# Patient Record
Sex: Female | Born: 1950
Health system: Southern US, Community
[De-identification: ages and names within clinical notes are randomized; demographics above are authoritative.]

## PROBLEM LIST (undated history)

## (undated) DIAGNOSIS — E119 Type 2 diabetes mellitus without complications: Secondary | ICD-10-CM

## (undated) DIAGNOSIS — G4730 Sleep apnea, unspecified: Secondary | ICD-10-CM

## (undated) DIAGNOSIS — N281 Cyst of kidney, acquired: Secondary | ICD-10-CM

## (undated) DIAGNOSIS — K219 Gastro-esophageal reflux disease without esophagitis: Secondary | ICD-10-CM

## (undated) DIAGNOSIS — F209 Schizophrenia, unspecified: Secondary | ICD-10-CM

## (undated) DIAGNOSIS — I509 Heart failure, unspecified: Secondary | ICD-10-CM

## (undated) DIAGNOSIS — K648 Other hemorrhoids: Secondary | ICD-10-CM

## (undated) DIAGNOSIS — E785 Hyperlipidemia, unspecified: Secondary | ICD-10-CM

## (undated) DIAGNOSIS — H409 Unspecified glaucoma: Secondary | ICD-10-CM

## (undated) DIAGNOSIS — I10 Essential (primary) hypertension: Secondary | ICD-10-CM

## (undated) DIAGNOSIS — F329 Major depressive disorder, single episode, unspecified: Secondary | ICD-10-CM

## (undated) DIAGNOSIS — M1990 Unspecified osteoarthritis, unspecified site: Secondary | ICD-10-CM

## (undated) DIAGNOSIS — K5790 Diverticulosis of intestine, part unspecified, without perforation or abscess without bleeding: Secondary | ICD-10-CM

## (undated) DIAGNOSIS — F32A Depression, unspecified: Secondary | ICD-10-CM

## (undated) DIAGNOSIS — E669 Obesity, unspecified: Secondary | ICD-10-CM

## (undated) HISTORY — DX: Gastro-esophageal reflux disease without esophagitis: K21.9

## (undated) HISTORY — DX: Essential (primary) hypertension: I10

## (undated) HISTORY — PX: CARPAL TUNNEL RELEASE: SHX101

## (undated) HISTORY — DX: Diverticulosis of intestine, part unspecified, without perforation or abscess without bleeding: K57.90

## (undated) HISTORY — DX: Sleep apnea, unspecified: G47.30

## (undated) HISTORY — DX: Hyperlipidemia, unspecified: E78.5

## (undated) HISTORY — DX: Obesity, unspecified: E66.9

## (undated) HISTORY — PX: LAPAROSCOPY: SHX197

## (undated) HISTORY — DX: Major depressive disorder, single episode, unspecified: F32.9

## (undated) HISTORY — DX: Heart failure, unspecified: I50.9

## (undated) HISTORY — DX: Unspecified osteoarthritis, unspecified site: M19.90

## (undated) HISTORY — DX: Depression, unspecified: F32.A

## (undated) HISTORY — PX: CATARACT EXTRACTION, BILATERAL: SHX1313

## (undated) HISTORY — PX: WRIST SURGERY: SHX841

## (undated) HISTORY — PX: ABDOMINAL HYSTERECTOMY: SHX81

## (undated) HISTORY — DX: Type 2 diabetes mellitus without complications: E11.9

## (undated) HISTORY — DX: Other hemorrhoids: K64.8

## (undated) HISTORY — PX: EYE SURGERY: SHX253

## (undated) HISTORY — DX: Unspecified glaucoma: H40.9

## (undated) HISTORY — DX: Schizophrenia, unspecified: F20.9

---
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
## 1999-10-12 ENCOUNTER — Encounter: Payer: Self-pay | Admitting: Emergency Medicine

## 1999-10-12 ENCOUNTER — Inpatient Hospital Stay (HOSPITAL_COMMUNITY): Admission: EM | Admit: 1999-10-12 | Discharge: 1999-10-18 | Payer: Self-pay | Admitting: Emergency Medicine

## 1999-10-17 ENCOUNTER — Encounter: Payer: Self-pay | Admitting: Family Medicine

## 1999-10-24 ENCOUNTER — Encounter: Admission: RE | Admit: 1999-10-24 | Discharge: 1999-10-24 | Payer: Self-pay | Admitting: Family Medicine

## 1999-11-14 ENCOUNTER — Encounter: Admission: RE | Admit: 1999-11-14 | Discharge: 1999-11-14 | Payer: Self-pay | Admitting: Family Medicine

## 1999-11-14 ENCOUNTER — Encounter: Admission: RE | Admit: 1999-11-14 | Discharge: 1999-11-14 | Payer: Self-pay | Admitting: Sports Medicine

## 1999-11-14 ENCOUNTER — Ambulatory Visit (HOSPITAL_COMMUNITY): Admission: RE | Admit: 1999-11-14 | Discharge: 1999-11-14 | Payer: Self-pay | Admitting: *Deleted

## 1999-11-14 ENCOUNTER — Encounter: Payer: Self-pay | Admitting: Sports Medicine

## 1999-11-21 ENCOUNTER — Encounter: Admission: RE | Admit: 1999-11-21 | Discharge: 1999-11-21 | Payer: Self-pay | Admitting: Family Medicine

## 1999-12-11 ENCOUNTER — Encounter: Admission: RE | Admit: 1999-12-11 | Discharge: 1999-12-11 | Payer: Self-pay | Admitting: Family Medicine

## 2000-01-21 ENCOUNTER — Ambulatory Visit (HOSPITAL_BASED_OUTPATIENT_CLINIC_OR_DEPARTMENT_OTHER): Admission: RE | Admit: 2000-01-21 | Discharge: 2000-01-21 | Payer: Self-pay

## 2000-01-24 ENCOUNTER — Encounter: Admission: RE | Admit: 2000-01-24 | Discharge: 2000-01-24 | Payer: Self-pay | Admitting: Family Medicine

## 2000-01-31 ENCOUNTER — Encounter: Admission: RE | Admit: 2000-01-31 | Discharge: 2000-04-30 | Payer: Self-pay | Admitting: *Deleted

## 2000-02-19 ENCOUNTER — Encounter: Admission: RE | Admit: 2000-02-19 | Discharge: 2000-02-19 | Payer: Self-pay | Admitting: Family Medicine

## 2000-05-06 ENCOUNTER — Ambulatory Visit (HOSPITAL_COMMUNITY): Admission: RE | Admit: 2000-05-06 | Discharge: 2000-05-06 | Payer: Self-pay | Admitting: Family Medicine

## 2000-05-06 ENCOUNTER — Encounter: Admission: RE | Admit: 2000-05-06 | Discharge: 2000-05-06 | Payer: Self-pay | Admitting: Family Medicine

## 2000-05-21 ENCOUNTER — Encounter: Admission: RE | Admit: 2000-05-21 | Discharge: 2000-05-21 | Payer: Self-pay | Admitting: Family Medicine

## 2000-05-26 ENCOUNTER — Encounter: Admission: RE | Admit: 2000-05-26 | Discharge: 2000-08-24 | Payer: Self-pay | Admitting: *Deleted

## 2000-06-17 ENCOUNTER — Encounter: Admission: RE | Admit: 2000-06-17 | Discharge: 2000-06-17 | Payer: Self-pay | Admitting: Family Medicine

## 2000-07-07 ENCOUNTER — Encounter: Admission: RE | Admit: 2000-07-07 | Discharge: 2000-07-07 | Payer: Self-pay | Admitting: Family Medicine

## 2000-08-27 ENCOUNTER — Encounter: Admission: RE | Admit: 2000-08-27 | Discharge: 2000-08-27 | Payer: Self-pay | Admitting: Family Medicine

## 2000-10-08 ENCOUNTER — Emergency Department (HOSPITAL_COMMUNITY): Admission: EM | Admit: 2000-10-08 | Discharge: 2000-10-08 | Payer: Self-pay | Admitting: Emergency Medicine

## 2000-10-09 ENCOUNTER — Encounter: Payer: Self-pay | Admitting: Emergency Medicine

## 2000-10-21 ENCOUNTER — Encounter: Admission: RE | Admit: 2000-10-21 | Discharge: 2001-01-19 | Payer: Self-pay | Admitting: Sports Medicine

## 2001-03-02 ENCOUNTER — Encounter: Admission: RE | Admit: 2001-03-02 | Discharge: 2001-05-31 | Payer: Self-pay | Admitting: Sports Medicine

## 2001-04-02 ENCOUNTER — Encounter: Admission: RE | Admit: 2001-04-02 | Discharge: 2001-04-02 | Payer: Self-pay | Admitting: Sports Medicine

## 2001-04-08 ENCOUNTER — Encounter: Admission: RE | Admit: 2001-04-08 | Discharge: 2001-04-08 | Payer: Self-pay | Admitting: Family Medicine

## 2001-05-03 ENCOUNTER — Ambulatory Visit (HOSPITAL_BASED_OUTPATIENT_CLINIC_OR_DEPARTMENT_OTHER): Admission: RE | Admit: 2001-05-03 | Discharge: 2001-05-03 | Payer: Self-pay | Admitting: Family Medicine

## 2001-06-11 ENCOUNTER — Encounter: Admission: RE | Admit: 2001-06-11 | Discharge: 2001-09-09 | Payer: Self-pay | Admitting: Sports Medicine

## 2001-06-15 ENCOUNTER — Encounter: Admission: RE | Admit: 2001-06-15 | Discharge: 2001-06-15 | Payer: Self-pay | Admitting: Family Medicine

## 2001-06-21 ENCOUNTER — Encounter (INDEPENDENT_AMBULATORY_CARE_PROVIDER_SITE_OTHER): Payer: Self-pay | Admitting: *Deleted

## 2001-06-30 ENCOUNTER — Encounter: Admission: RE | Admit: 2001-06-30 | Discharge: 2001-06-30 | Payer: Self-pay | Admitting: Sports Medicine

## 2001-06-30 ENCOUNTER — Encounter: Payer: Self-pay | Admitting: Sports Medicine

## 2001-08-31 ENCOUNTER — Encounter: Admission: RE | Admit: 2001-08-31 | Discharge: 2001-08-31 | Payer: Self-pay | Admitting: Family Medicine

## 2001-09-04 ENCOUNTER — Encounter: Payer: Self-pay | Admitting: Ophthalmology

## 2001-09-07 ENCOUNTER — Ambulatory Visit (HOSPITAL_COMMUNITY): Admission: RE | Admit: 2001-09-07 | Discharge: 2001-09-07 | Payer: Self-pay | Admitting: Ophthalmology

## 2001-09-18 ENCOUNTER — Encounter: Admission: RE | Admit: 2001-09-18 | Discharge: 2001-12-17 | Payer: Self-pay | Admitting: Sports Medicine

## 2002-01-06 ENCOUNTER — Encounter: Admission: RE | Admit: 2002-01-06 | Discharge: 2002-01-06 | Payer: Self-pay | Admitting: Family Medicine

## 2002-01-21 ENCOUNTER — Encounter: Admission: RE | Admit: 2002-01-21 | Discharge: 2002-01-21 | Payer: Self-pay | Admitting: Family Medicine

## 2002-01-27 ENCOUNTER — Encounter: Admission: RE | Admit: 2002-01-27 | Discharge: 2002-04-27 | Payer: Self-pay | Admitting: Sports Medicine

## 2002-02-01 ENCOUNTER — Ambulatory Visit (HOSPITAL_COMMUNITY): Admission: RE | Admit: 2002-02-01 | Discharge: 2002-02-01 | Payer: Self-pay | Admitting: Ophthalmology

## 2002-03-05 ENCOUNTER — Encounter: Admission: RE | Admit: 2002-03-05 | Discharge: 2002-03-05 | Payer: Self-pay | Admitting: Family Medicine

## 2002-03-11 ENCOUNTER — Encounter: Admission: RE | Admit: 2002-03-11 | Discharge: 2002-03-11 | Payer: Self-pay | Admitting: Family Medicine

## 2002-04-14 ENCOUNTER — Ambulatory Visit (HOSPITAL_COMMUNITY): Admission: RE | Admit: 2002-04-14 | Discharge: 2002-04-14 | Payer: Self-pay | Admitting: Ophthalmology

## 2002-05-17 ENCOUNTER — Encounter: Admission: RE | Admit: 2002-05-17 | Discharge: 2002-05-17 | Payer: Self-pay | Admitting: Family Medicine

## 2002-07-27 ENCOUNTER — Encounter: Admission: RE | Admit: 2002-07-27 | Discharge: 2002-07-27 | Payer: Self-pay | Admitting: Family Medicine

## 2002-08-10 ENCOUNTER — Encounter: Admission: RE | Admit: 2002-08-10 | Discharge: 2002-08-10 | Payer: Self-pay | Admitting: Sports Medicine

## 2002-08-10 ENCOUNTER — Encounter: Payer: Self-pay | Admitting: Sports Medicine

## 2002-08-24 ENCOUNTER — Encounter: Admission: RE | Admit: 2002-08-24 | Discharge: 2002-11-22 | Payer: Self-pay | Admitting: Sports Medicine

## 2002-09-06 ENCOUNTER — Encounter: Admission: RE | Admit: 2002-09-06 | Discharge: 2002-09-06 | Payer: Self-pay | Admitting: Family Medicine

## 2002-09-23 ENCOUNTER — Encounter: Admission: RE | Admit: 2002-09-23 | Discharge: 2002-09-23 | Payer: Self-pay | Admitting: Family Medicine

## 2002-10-08 ENCOUNTER — Encounter: Admission: RE | Admit: 2002-10-08 | Discharge: 2002-10-08 | Payer: Self-pay | Admitting: Family Medicine

## 2002-10-14 ENCOUNTER — Encounter: Admission: RE | Admit: 2002-10-14 | Discharge: 2002-10-14 | Payer: Self-pay | Admitting: Family Medicine

## 2003-02-25 ENCOUNTER — Encounter: Admission: RE | Admit: 2003-02-25 | Discharge: 2003-02-25 | Payer: Self-pay | Admitting: Family Medicine

## 2003-03-02 ENCOUNTER — Encounter: Admission: RE | Admit: 2003-03-02 | Discharge: 2003-03-02 | Payer: Self-pay | Admitting: Sports Medicine

## 2003-03-02 ENCOUNTER — Encounter: Payer: Self-pay | Admitting: Sports Medicine

## 2003-03-30 ENCOUNTER — Encounter: Admission: RE | Admit: 2003-03-30 | Discharge: 2003-03-30 | Payer: Self-pay | Admitting: Family Medicine

## 2003-04-28 ENCOUNTER — Encounter: Admission: RE | Admit: 2003-04-28 | Discharge: 2003-04-28 | Payer: Self-pay | Admitting: Family Medicine

## 2003-05-12 ENCOUNTER — Encounter: Admission: RE | Admit: 2003-05-12 | Discharge: 2003-05-12 | Payer: Self-pay | Admitting: Family Medicine

## 2003-08-10 ENCOUNTER — Emergency Department (HOSPITAL_COMMUNITY): Admission: EM | Admit: 2003-08-10 | Discharge: 2003-08-10 | Payer: Self-pay | Admitting: Emergency Medicine

## 2003-09-07 ENCOUNTER — Encounter: Admission: RE | Admit: 2003-09-07 | Discharge: 2003-09-07 | Payer: Self-pay | Admitting: Family Medicine

## 2003-09-30 ENCOUNTER — Encounter: Admission: RE | Admit: 2003-09-30 | Discharge: 2003-09-30 | Payer: Self-pay | Admitting: Family Medicine

## 2003-10-13 ENCOUNTER — Encounter: Admission: RE | Admit: 2003-10-13 | Discharge: 2003-10-13 | Payer: Self-pay | Admitting: Sports Medicine

## 2003-12-15 ENCOUNTER — Encounter: Admission: RE | Admit: 2003-12-15 | Discharge: 2003-12-15 | Payer: Self-pay | Admitting: Family Medicine

## 2003-12-23 ENCOUNTER — Encounter: Admission: RE | Admit: 2003-12-23 | Discharge: 2003-12-23 | Payer: Self-pay | Admitting: Sports Medicine

## 2004-01-16 ENCOUNTER — Encounter: Admission: RE | Admit: 2004-01-16 | Discharge: 2004-01-16 | Payer: Self-pay | Admitting: Family Medicine

## 2004-04-21 ENCOUNTER — Emergency Department (HOSPITAL_COMMUNITY): Admission: EM | Admit: 2004-04-21 | Discharge: 2004-04-21 | Payer: Self-pay | Admitting: Emergency Medicine

## 2004-04-26 ENCOUNTER — Ambulatory Visit: Payer: Self-pay | Admitting: Family Medicine

## 2004-05-25 ENCOUNTER — Ambulatory Visit: Payer: Self-pay | Admitting: Family Medicine

## 2004-06-08 ENCOUNTER — Encounter: Admission: RE | Admit: 2004-06-08 | Discharge: 2004-08-01 | Payer: Self-pay | Admitting: Sports Medicine

## 2004-08-24 ENCOUNTER — Ambulatory Visit: Payer: Self-pay | Admitting: Sports Medicine

## 2004-09-27 ENCOUNTER — Ambulatory Visit: Payer: Self-pay | Admitting: Sports Medicine

## 2004-12-07 ENCOUNTER — Ambulatory Visit: Payer: Self-pay | Admitting: Family Medicine

## 2004-12-25 ENCOUNTER — Encounter: Admission: RE | Admit: 2004-12-25 | Discharge: 2004-12-25 | Payer: Self-pay | Admitting: Sports Medicine

## 2005-05-03 ENCOUNTER — Ambulatory Visit: Payer: Self-pay | Admitting: Family Medicine

## 2005-08-02 ENCOUNTER — Ambulatory Visit: Payer: Self-pay | Admitting: Sports Medicine

## 2005-08-15 ENCOUNTER — Ambulatory Visit: Payer: Self-pay | Admitting: Sports Medicine

## 2005-09-06 ENCOUNTER — Ambulatory Visit: Payer: Self-pay | Admitting: Sports Medicine

## 2005-09-12 ENCOUNTER — Inpatient Hospital Stay (HOSPITAL_COMMUNITY): Admission: EM | Admit: 2005-09-12 | Discharge: 2005-09-17 | Payer: Self-pay | Admitting: Emergency Medicine

## 2005-09-12 ENCOUNTER — Ambulatory Visit: Payer: Self-pay | Admitting: Family Medicine

## 2005-09-19 ENCOUNTER — Ambulatory Visit: Payer: Self-pay | Admitting: Family Medicine

## 2005-09-21 ENCOUNTER — Emergency Department (HOSPITAL_COMMUNITY): Admission: EM | Admit: 2005-09-21 | Discharge: 2005-09-22 | Payer: Self-pay | Admitting: Emergency Medicine

## 2005-10-14 ENCOUNTER — Ambulatory Visit: Payer: Self-pay | Admitting: Family Medicine

## 2005-10-16 ENCOUNTER — Ambulatory Visit: Payer: Self-pay

## 2005-10-30 ENCOUNTER — Ambulatory Visit: Payer: Self-pay | Admitting: Family Medicine

## 2005-11-29 ENCOUNTER — Encounter: Admission: RE | Admit: 2005-11-29 | Discharge: 2005-11-29 | Payer: Self-pay | Admitting: Sports Medicine

## 2005-11-29 ENCOUNTER — Ambulatory Visit: Payer: Self-pay | Admitting: Family Medicine

## 2005-12-10 ENCOUNTER — Ambulatory Visit: Payer: Self-pay | Admitting: Sports Medicine

## 2005-12-30 ENCOUNTER — Ambulatory Visit: Payer: Self-pay | Admitting: Family Medicine

## 2006-02-17 ENCOUNTER — Ambulatory Visit: Payer: Self-pay | Admitting: Family Medicine

## 2006-03-03 ENCOUNTER — Ambulatory Visit: Payer: Self-pay | Admitting: Family Medicine

## 2006-03-21 ENCOUNTER — Ambulatory Visit: Payer: Self-pay | Admitting: Family Medicine

## 2006-04-23 ENCOUNTER — Ambulatory Visit: Payer: Self-pay | Admitting: Sports Medicine

## 2006-04-30 ENCOUNTER — Ambulatory Visit: Payer: Self-pay | Admitting: Sports Medicine

## 2006-07-30 ENCOUNTER — Ambulatory Visit: Payer: Self-pay | Admitting: Family Medicine

## 2006-09-18 DIAGNOSIS — I10 Essential (primary) hypertension: Secondary | ICD-10-CM | POA: Insufficient documentation

## 2006-09-18 DIAGNOSIS — F329 Major depressive disorder, single episode, unspecified: Secondary | ICD-10-CM

## 2006-09-18 DIAGNOSIS — I270 Primary pulmonary hypertension: Secondary | ICD-10-CM | POA: Insufficient documentation

## 2006-09-18 DIAGNOSIS — I5032 Chronic diastolic (congestive) heart failure: Secondary | ICD-10-CM

## 2006-09-18 DIAGNOSIS — F209 Schizophrenia, unspecified: Secondary | ICD-10-CM | POA: Insufficient documentation

## 2006-09-18 DIAGNOSIS — E119 Type 2 diabetes mellitus without complications: Secondary | ICD-10-CM

## 2006-09-18 DIAGNOSIS — F32A Depression, unspecified: Secondary | ICD-10-CM | POA: Insufficient documentation

## 2006-09-18 DIAGNOSIS — M1990 Unspecified osteoarthritis, unspecified site: Secondary | ICD-10-CM | POA: Insufficient documentation

## 2006-09-18 DIAGNOSIS — E669 Obesity, unspecified: Secondary | ICD-10-CM | POA: Insufficient documentation

## 2006-09-18 DIAGNOSIS — G4730 Sleep apnea, unspecified: Secondary | ICD-10-CM

## 2006-09-19 ENCOUNTER — Encounter (INDEPENDENT_AMBULATORY_CARE_PROVIDER_SITE_OTHER): Payer: Self-pay | Admitting: *Deleted

## 2006-10-06 ENCOUNTER — Ambulatory Visit: Payer: Self-pay | Admitting: Family Medicine

## 2006-10-06 ENCOUNTER — Encounter (INDEPENDENT_AMBULATORY_CARE_PROVIDER_SITE_OTHER): Payer: Self-pay | Admitting: *Deleted

## 2006-10-06 DIAGNOSIS — E785 Hyperlipidemia, unspecified: Secondary | ICD-10-CM

## 2006-10-06 LAB — CONVERTED CEMR LAB
BUN: 9 mg/dL (ref 6–23)
CO2: 24 meq/L (ref 19–32)
Chloride: 105 meq/L (ref 96–112)
Creatinine, Ser: 0.79 mg/dL (ref 0.40–1.20)
Potassium: 3.8 meq/L (ref 3.5–5.3)

## 2006-10-09 ENCOUNTER — Telehealth (INDEPENDENT_AMBULATORY_CARE_PROVIDER_SITE_OTHER): Payer: Self-pay | Admitting: *Deleted

## 2006-10-10 ENCOUNTER — Telehealth (INDEPENDENT_AMBULATORY_CARE_PROVIDER_SITE_OTHER): Payer: Self-pay | Admitting: *Deleted

## 2006-10-14 ENCOUNTER — Telehealth (INDEPENDENT_AMBULATORY_CARE_PROVIDER_SITE_OTHER): Payer: Self-pay | Admitting: *Deleted

## 2006-10-21 ENCOUNTER — Encounter (INDEPENDENT_AMBULATORY_CARE_PROVIDER_SITE_OTHER): Payer: Self-pay | Admitting: *Deleted

## 2006-10-24 ENCOUNTER — Telehealth (INDEPENDENT_AMBULATORY_CARE_PROVIDER_SITE_OTHER): Payer: Self-pay | Admitting: *Deleted

## 2006-11-11 ENCOUNTER — Ambulatory Visit: Payer: Self-pay | Admitting: Family Medicine

## 2006-11-11 ENCOUNTER — Telehealth (INDEPENDENT_AMBULATORY_CARE_PROVIDER_SITE_OTHER): Payer: Self-pay | Admitting: *Deleted

## 2006-11-11 ENCOUNTER — Encounter (INDEPENDENT_AMBULATORY_CARE_PROVIDER_SITE_OTHER): Payer: Self-pay | Admitting: *Deleted

## 2006-11-12 ENCOUNTER — Encounter (INDEPENDENT_AMBULATORY_CARE_PROVIDER_SITE_OTHER): Payer: Self-pay | Admitting: *Deleted

## 2006-11-12 ENCOUNTER — Telehealth (INDEPENDENT_AMBULATORY_CARE_PROVIDER_SITE_OTHER): Payer: Self-pay | Admitting: *Deleted

## 2006-12-12 ENCOUNTER — Encounter (INDEPENDENT_AMBULATORY_CARE_PROVIDER_SITE_OTHER): Payer: Self-pay | Admitting: *Deleted

## 2006-12-19 ENCOUNTER — Encounter: Admission: RE | Admit: 2006-12-19 | Discharge: 2006-12-19 | Payer: Self-pay | Admitting: *Deleted

## 2006-12-22 ENCOUNTER — Encounter (INDEPENDENT_AMBULATORY_CARE_PROVIDER_SITE_OTHER): Payer: Self-pay | Admitting: *Deleted

## 2007-01-06 ENCOUNTER — Ambulatory Visit: Payer: Self-pay | Admitting: Family Medicine

## 2007-01-06 LAB — CONVERTED CEMR LAB: Hgb A1c MFr Bld: 7.7 %

## 2007-07-31 ENCOUNTER — Ambulatory Visit: Payer: Self-pay | Admitting: Family Medicine

## 2007-07-31 ENCOUNTER — Telehealth (INDEPENDENT_AMBULATORY_CARE_PROVIDER_SITE_OTHER): Payer: Self-pay | Admitting: *Deleted

## 2007-09-16 ENCOUNTER — Ambulatory Visit: Payer: Self-pay | Admitting: Family Medicine

## 2007-09-23 ENCOUNTER — Ambulatory Visit: Payer: Self-pay | Admitting: Family Medicine

## 2007-09-23 ENCOUNTER — Encounter (INDEPENDENT_AMBULATORY_CARE_PROVIDER_SITE_OTHER): Payer: Self-pay | Admitting: *Deleted

## 2007-09-23 LAB — CONVERTED CEMR LAB: Hgb A1c MFr Bld: 8.3 %

## 2007-09-24 ENCOUNTER — Encounter (INDEPENDENT_AMBULATORY_CARE_PROVIDER_SITE_OTHER): Payer: Self-pay | Admitting: *Deleted

## 2007-09-24 LAB — CONVERTED CEMR LAB
ALT: 16 units/L (ref 0–35)
AST: 16 units/L (ref 0–37)
Albumin: 4.1 g/dL (ref 3.5–5.2)
CO2: 24 meq/L (ref 19–32)
Calcium: 9.5 mg/dL (ref 8.4–10.5)
Chloride: 100 meq/L (ref 96–112)
Cholesterol: 188 mg/dL (ref 0–200)
Creatinine, Ser: 0.69 mg/dL (ref 0.40–1.20)
Potassium: 4.3 meq/L (ref 3.5–5.3)
TSH: 1.473 microintl units/mL (ref 0.350–5.50)
Total CHOL/HDL Ratio: 4

## 2007-11-11 ENCOUNTER — Ambulatory Visit: Payer: Self-pay | Admitting: Family Medicine

## 2007-12-04 ENCOUNTER — Telehealth: Payer: Self-pay | Admitting: *Deleted

## 2007-12-16 ENCOUNTER — Ambulatory Visit: Payer: Self-pay | Admitting: Family Medicine

## 2007-12-17 ENCOUNTER — Ambulatory Visit: Payer: Self-pay | Admitting: Family Medicine

## 2008-01-13 ENCOUNTER — Encounter (INDEPENDENT_AMBULATORY_CARE_PROVIDER_SITE_OTHER): Payer: Self-pay | Admitting: *Deleted

## 2008-01-26 ENCOUNTER — Ambulatory Visit: Payer: Self-pay | Admitting: Family Medicine

## 2008-02-15 ENCOUNTER — Encounter: Payer: Self-pay | Admitting: Family Medicine

## 2008-03-09 ENCOUNTER — Encounter: Payer: Self-pay | Admitting: Family Medicine

## 2008-05-16 ENCOUNTER — Ambulatory Visit: Payer: Self-pay | Admitting: Family Medicine

## 2008-06-09 ENCOUNTER — Emergency Department (HOSPITAL_COMMUNITY): Admission: EM | Admit: 2008-06-09 | Discharge: 2008-06-09 | Payer: Self-pay | Admitting: Emergency Medicine

## 2008-06-21 ENCOUNTER — Encounter: Payer: Self-pay | Admitting: *Deleted

## 2008-06-22 ENCOUNTER — Ambulatory Visit: Payer: Self-pay | Admitting: Family Medicine

## 2008-10-13 ENCOUNTER — Ambulatory Visit: Payer: Self-pay | Admitting: Family Medicine

## 2008-10-13 ENCOUNTER — Encounter: Payer: Self-pay | Admitting: Family Medicine

## 2008-10-13 ENCOUNTER — Telehealth: Payer: Self-pay | Admitting: Gastroenterology

## 2008-10-13 LAB — CONVERTED CEMR LAB
AST: 16 units/L (ref 0–37)
Albumin: 4.1 g/dL (ref 3.5–5.2)
BUN: 9 mg/dL (ref 6–23)
CO2: 22 meq/L (ref 19–32)
Calcium: 9.2 mg/dL (ref 8.4–10.5)
Chloride: 110 meq/L (ref 96–112)
Creatinine, Ser: 0.87 mg/dL (ref 0.40–1.20)
Glucose, Bld: 122 mg/dL — ABNORMAL HIGH (ref 70–99)
HCT: 32.8 % — ABNORMAL LOW (ref 36.0–46.0)
Hemoglobin: 10.7 g/dL — ABNORMAL LOW (ref 12.0–15.0)
Hgb A1c MFr Bld: 7.3 %
Potassium: 4 meq/L (ref 3.5–5.3)
RBC: 4.11 M/uL (ref 3.87–5.11)
RDW: 15.9 % — ABNORMAL HIGH (ref 11.5–15.5)
WBC: 6.3 10*3/uL (ref 4.0–10.5)

## 2008-10-19 ENCOUNTER — Encounter: Admission: RE | Admit: 2008-10-19 | Discharge: 2008-10-19 | Payer: Self-pay | Admitting: Family Medicine

## 2009-01-17 ENCOUNTER — Ambulatory Visit: Payer: Self-pay | Admitting: Family Medicine

## 2009-01-17 LAB — CONVERTED CEMR LAB: Hgb A1c MFr Bld: 6.6 %

## 2009-02-10 ENCOUNTER — Ambulatory Visit: Payer: Self-pay | Admitting: Family Medicine

## 2009-02-10 ENCOUNTER — Telehealth: Payer: Self-pay | Admitting: Family Medicine

## 2009-02-10 ENCOUNTER — Ambulatory Visit (HOSPITAL_COMMUNITY): Admission: RE | Admit: 2009-02-10 | Discharge: 2009-02-10 | Payer: Self-pay | Admitting: Family Medicine

## 2009-02-17 ENCOUNTER — Ambulatory Visit: Payer: Self-pay | Admitting: Family Medicine

## 2009-02-27 ENCOUNTER — Ambulatory Visit: Payer: Self-pay | Admitting: Family Medicine

## 2009-03-07 ENCOUNTER — Ambulatory Visit: Payer: Self-pay

## 2009-03-07 DIAGNOSIS — M25569 Pain in unspecified knee: Secondary | ICD-10-CM

## 2009-03-07 DIAGNOSIS — M545 Low back pain: Secondary | ICD-10-CM

## 2009-04-18 ENCOUNTER — Ambulatory Visit: Payer: Self-pay | Admitting: Sports Medicine

## 2009-04-21 ENCOUNTER — Telehealth (INDEPENDENT_AMBULATORY_CARE_PROVIDER_SITE_OTHER): Payer: Self-pay | Admitting: *Deleted

## 2009-04-25 ENCOUNTER — Encounter: Payer: Self-pay | Admitting: Family Medicine

## 2009-04-25 ENCOUNTER — Ambulatory Visit: Payer: Self-pay | Admitting: Family Medicine

## 2009-05-29 ENCOUNTER — Ambulatory Visit: Payer: Self-pay | Admitting: Family Medicine

## 2009-05-29 ENCOUNTER — Encounter: Payer: Self-pay | Admitting: Family Medicine

## 2009-05-29 DIAGNOSIS — N951 Menopausal and female climacteric states: Secondary | ICD-10-CM

## 2009-05-29 LAB — CONVERTED CEMR LAB
Hgb A1c MFr Bld: 6.7 %
TSH: 1.423 microintl units/mL (ref 0.350–4.500)

## 2009-10-03 ENCOUNTER — Encounter: Payer: Self-pay | Admitting: Family Medicine

## 2009-10-03 ENCOUNTER — Ambulatory Visit: Payer: Self-pay | Admitting: Family Medicine

## 2009-10-03 LAB — CONVERTED CEMR LAB
HDL: 55 mg/dL (ref >39)
Hgb A1c MFr Bld: 6.9 %
LDL Cholesterol: 110 mg/dL — ABNORMAL HIGH (ref 0–99)

## 2009-10-04 ENCOUNTER — Encounter: Payer: Self-pay | Admitting: Family Medicine

## 2009-11-09 ENCOUNTER — Ambulatory Visit: Payer: Self-pay | Admitting: Family Medicine

## 2010-04-25 ENCOUNTER — Ambulatory Visit: Payer: Self-pay | Admitting: Family Medicine

## 2010-04-27 ENCOUNTER — Encounter: Payer: Self-pay | Admitting: Family Medicine

## 2010-04-27 ENCOUNTER — Encounter: Payer: Self-pay | Admitting: *Deleted

## 2010-05-02 ENCOUNTER — Ambulatory Visit: Payer: Self-pay | Admitting: Family Medicine

## 2010-05-02 LAB — CONVERTED CEMR LAB: Hgb A1c MFr Bld: 6.6 %

## 2010-05-16 ENCOUNTER — Encounter (INDEPENDENT_AMBULATORY_CARE_PROVIDER_SITE_OTHER): Payer: Self-pay | Admitting: Pharmacist

## 2010-05-25 ENCOUNTER — Encounter: Payer: Self-pay | Admitting: Family Medicine

## 2010-06-11 ENCOUNTER — Telehealth: Payer: Self-pay | Admitting: Family Medicine

## 2010-06-18 ENCOUNTER — Ambulatory Visit: Payer: Self-pay | Admitting: Family Medicine

## 2010-06-18 DIAGNOSIS — F17200 Nicotine dependence, unspecified, uncomplicated: Secondary | ICD-10-CM

## 2010-06-22 ENCOUNTER — Telehealth: Payer: Self-pay | Admitting: Family Medicine

## 2010-06-28 ENCOUNTER — Encounter: Payer: Self-pay | Admitting: Family Medicine

## 2010-08-21 NOTE — Assessment & Plan Note (Signed)
Summary: knee pain,df   Vital Signs:  Patient profile:   60 year old female Height:      64.5 inches Weight:      237 pounds BMI:     40.20 BSA:     2.12 Temp:     98.3 degrees F Pulse rate:   78 / minute BP sitting:   170 / 78  Vitals Entered By: Jone Baseman CMA (June 18, 2010 10:50 AM) CC: left knee pain, depression, HTN Is Patient Diabetic? No Pain Assessment Patient in pain? yes     Location: left knee Intensity: 9   Primary Care Provider:  Angelena Sole MD  CC:  left knee pain, depression, and HTN.  History of Present Illness: 1. Left knee pain:  Pt with a hx of chronic bilateral knee pain.  She has underlying osteoarthritis and has been told by an Orthopedic surgeon that she needs surgery but has to lose weight before he is willing to do it.  1 week ago she was walking down her steps when she slipped and landed directly on the medial part of her left knee.  She had immediate pain and swelling.  She was able to ambulate back up stairs into her apartment.  Since then she has had bad knee pain.  Pain is currently rated a 8/10  ROS: denies catching, locking, or giving out  2. Depression:  She has felt more depressed recently.  She is worried about her daughter who has been dealing with her own issues like drugs and her grandson who also has issues with depression.  She is followed by Mental Health for depression and they made some changes to her medicines about 1 month ago, including decreasing her Seroquel.  ROS: endorses problems with sad mood, energy, and sleep.  Denies SI.  3. HTN:  She hasn't been taking her BP medicines recently.  She thinks that they were causing her to lose her hair so she stopped taking them a while ago.  She doesn't check her blood pressure at home regularly.  ROS: denies chest pain, shortness of breath  4. Smoking:  She has started smoking because of her mood.  She is only smoking occassionally and wants to quit.  Habits &  Providers  Alcohol-Tobacco-Diet     Tobacco Status: current     Tobacco Counseling: to quit use of tobacco products  Exercise-Depression-Behavior     Have you felt down or hopeless? yes     Have you felt little pleasure in things? yes     Depression Counseling: further diagnostic testing and/or other treatment is indicated  Current Medications (verified): 1)  Lorazepam 1 Mg  Tabs (Lorazepam) .... One Tab By Mouth At Bedtime Prescribed By Mental Health 2)  Wellbutrin Xl 300 Mg  Tb24 (Bupropion Hcl) .... One Daily Per Mental [facility] 3)  Hydrochlorothiazide 25 Mg  Tabs (Hydrochlorothiazide) .... Take 1 Tab By Mouth Every Morning 4)  Norvasc 10 Mg  Tabs (Amlodipine Besylate) .... One Tab By Mouth Daily 5)  Lotensin 40 Mg  Tabs (Benazepril Hcl) .... One Tab By Mouth Daily 6)  Neurontin 400 Mg Caps (Gabapentin) .... One Tab By Mouth At Bedtime Per Mental [facility] 7)  Metformin Hcl 1000 Mg Tabs (Metformin Hcl) .... Take 1 Tablet By Mouth Two Times A Day 8)  Lamictal 200 Mg Tabs (Lamotrigine) .... One Tab By Mouth Daily Per [facility] 9)  Seroquel Xr 300 Mg  Tb24 (Quetiapine Fumarate) .... 1/2 Tabs Qhs Per Mental  Health 10)  Simvastatin 40 Mg Tabs (Simvastatin) .... Take Two Tablets At Bedtime 11)  Premarin 0.625 Mg Tabs (Estrogens Conjugated) .... Take 1 Tab By Mouth Daily 12)  Hydrocodone-Acetaminophen 5-500 Mg Tabs (Hydrocodone-Acetaminophen) .Marland Kitchen.. 1 Tab By Mouth Twice A Day As Needed For Knee Pain 13)  Nicotine 7 Mg/24hr Pt24 (Nicotine) .Marland Kitchen.. 1 Patch Daily As Needed To Help With Smoking Cessation 14)  Antibiotic Plus Pain Relief 3.5-10000-10 Crea (Neomycin-Polymyxin-Pramoxine) .... Apply Small Amount To Burns Twice A Day  Allergies: 1)  Aspirin (Aspirin)  Social History: Smoking Status:  current  Physical Exam  General:  Obese,in no acute distress; alert,appropriate and cooperative throughout examination. Vitals reviewed Eyes:  vision grossly intact, fundi normal appearing.   Neck:   supple and no masses.   Lungs:  normal respiratory effort, no crackles, and no wheezes.   Heart:  normal rate and regular rhythm.   Msk:  bilat chronic DJD changes LT knee:  no swelling or redness.  Painful along medial joint line.  Patella is non-tender.  Good stability.  Painful with full extension and flexion.  Negative McMurrays. RT knee gets full extension with creptiation under patella TTP along  med jt line bilat Extremities:  No clubbing, cyanosis, edema, or deformity noted   Psych:  normally interactive, good eye contact, and flat affect.   Additional Exam:  PHQ 9: 24 and somewhat difficult.  Denies SI.   Impression & Recommendations:  Problem # 1:  KNEE PAIN, LEFT, ACUTE (ICD-719.46) Assessment New  Traumatic injury.  No signs of fracture.  Will treat conservatively.  If not improved in a couple of weeks would consider referral to [facility] for ultrasound. The following medications were removed from the medication list:    Tramadol Hcl 50 Mg Tabs (Tramadol hcl) .Marland Kitchen... Take 1 tab by mouth every 6 hours as needed for pain Her updated medication list for this problem includes:    Hydrocodone-acetaminophen 5-500 Mg Tabs (Hydrocodone-acetaminophen) .Marland Kitchen... 1 tab by mouth twice a day as needed for knee pain  Orders: [facility]- Est  Level 4 (81191)  Problem # 2:  DEPRESSIVE DISORDER, NOS (ICD-311) Assessment: Deteriorated  Related to stress with her family.  Denies SI.  This could also be related to the medication changes at mental health.  Advised her to call and schedule an appointment there. Her updated medication list for this problem includes:    Lorazepam 1 Mg Tabs (Lorazepam) ..... One tab by mouth at bedtime prescribed by mental health    Wellbutrin Xl 300 Mg Tb24 (Bupropion hcl) ..... One daily per mental health  Orders: [facility]- Est  Level 4 ([address]29)  Problem # 3:  HYPERTENSION, BENIGN SYSTEMIC (ICD-401.1) Assessment: Deteriorated  Not taking her medicines.  Advised her that the blood  pressure medicines are not likely the cause for her hair loss and that she should be taking her medicines.  She would try them again.  Continue to monitor. Her updated medication list for this problem includes:    Hydrochlorothiazide 25 Mg Tabs (Hydrochlorothiazide) .Marland Kitchen... Take 1 tab by mouth every morning    Norvasc 10 Mg Tabs (Amlodipine besylate) ..... One tab by mouth daily    Lotensin 40 Mg Tabs (Benazepril hcl) ..... One tab by mouth daily  Orders: [facility]- Est  Level 4 ([address]213)  Problem # 4:  TOBACCO USER (ICD-305.1) Assessment: New  Encouraged her to try and quit.  Provided prescription for nicotine patches. Her updated medication list for this problem includes:    Nicotine  7 Mg/24hr Pt24 (Nicotine) .Marland Kitchen... 1 patch daily as needed to help with smoking cessation  Orders: [facility]- Est  Level 4 ([address])  Complete Medication List: 1)  Lorazepam 1 Mg Tabs (Lorazepam) .... One tab by mouth at bedtime prescribed by mental health 2)  Wellbutrin Xl 300 Mg Tb24 (Bupropion hcl) .... One daily per mental health 3)  Hydrochlorothiazide 25 Mg Tabs (Hydrochlorothiazide) .... Take 1 tab by mouth every morning 4)  Norvasc 10 Mg Tabs (Amlodipine besylate) .... One tab by mouth daily 5)  Lotensin 40 Mg Tabs (Benazepril hcl) .... One tab by mouth daily 6)  Neurontin 400 Mg Caps (Gabapentin) .... One tab by mouth at bedtime per mental health 7)  Metformin Hcl 1000 Mg Tabs (Metformin hcl) .... Take 1 tablet by mouth two times a day 8)  Lamictal 200 Mg Tabs (Lamotrigine) .... One tab by mouth daily per [facility] 9)  Seroquel Xr 300 Mg Tb24 (Quetiapine fumarate) .... 1/2 tabs qhs per mental health 10)  Simvastatin 40 Mg Tabs (Simvastatin) .... Take two tablets at bedtime 11)  Premarin 0.625 Mg Tabs (Estrogens conjugated) .... Take 1 tab by mouth daily 12)  Hydrocodone-acetaminophen 5-500 Mg Tabs (Hydrocodone-acetaminophen) .Marland Kitchen.. 1 tab by mouth twice a day as needed for knee pain 13)  Nicotine 7 Mg/24hr Pt24  (Nicotine) .Marland Kitchen.. 1 patch daily as needed to help with smoking cessation 14)  Antibiotic Plus Pain Relief 3.5-10000-10 Crea (Neomycin-polymyxin-pramoxine) .... Apply small amount to burns twice a day  Patient Instructions: 1)  We can try some Vicodin for the knee pain, if not better in a couple of weeks then we should send you to Sports medicine 2)  For your depression. I think that you should call and schedule an appointment with Mental Health since they are managing it. 3)  For the burn I have sent in a prescription for a cream that you can use 4)  I have also sent in a prescription for nicotine patches to help you stop smoking 5)  Please schedule an appointment in 3-4 weeks to follow up Prescriptions: ANTIBIOTIC PLUS PAIN RELIEF 3.5-10000-10 CREA (NEOMYCIN-POLYMYXIN-PRAMOXINE) Apply small amount to burns twice a day  #1 x 0   Entered and Authorized by:   Angelena Sole MD   Signed by:   Angelena Sole MD on 06/18/2010   Method used:   Electronically to        [facility] [id number] (retail)       [address] [place], [place]  [address]       Ph: [phone]       Fax: [phone]   RxID:   [id number] NICOTINE 7 MG/24HR PT24 (NICOTINE) 1 patch daily as needed to help with smoking cessation  #30 x 0   Entered and Authorized by:   Angelena Sole MD   Signed by:   Angelena Sole MD on 06/18/2010   Method used:   Electronically to        [facility] [id number] (retail)       [address] [place], [place]  [address]       Ph: [phone]       Fax: [phone]   RxID:   [id number] HYDROCODONE-ACETAMINOPHEN 5-500 MG TABS (HYDROCODONE-ACETAMINOPHEN) 1 tab by mouth twice a day as needed for knee pain  #  30 x 0   Entered and Authorized by:   Angelena Sole MD   Signed by:   Angelena Sole MD on 06/18/2010   Method used:   Print then Give to Patient   RxID:   [id number]    Orders Added: 1)  [facility]- Est   Level 4 [[address]52]

## 2010-08-21 NOTE — Assessment & Plan Note (Signed)
Summary: FU/KH   Vital Signs:  Patient profile:   60 year old female Height:      64.5 inches Weight:      237 pounds BMI:     40.20 Temp:     98.3 degrees F oral Pulse rate:   91 / minute BP sitting:   137 / 84  (left arm) Cuff size:   regular  Vitals Entered By: Garen Grams LPN (May 02, 2010 10:06 AM) CC: f/u dm, bp Is Patient Diabetic? Yes Did you bring your meter with you today? No Pain Assessment Patient in pain? yes     Location: back, knees   Primary Care Provider:  Angelena Sole MD  CC:  f/u dm and bp.  History of Present Illness: 1. DMII: - Taking her medicines as prescribed - She is checking her sugars about once per day and averaging around 120  ROS: denies vision changes  2. HTN - Taking her medicines as prescribed - Doesn't check her BP at home regularly  ROS: denies chest pain, shortness of breath  3. Obesity - Continues to work on losing weight - Has lost 13 lbs since last visit - trying to watch what she eats - unable to exercise much because of her knee pain  Habits & Providers  Alcohol-Tobacco-Diet     Tobacco Status: quit     Year Quit: 2006  Current Medications (verified): 1)  Lorazepam 1 Mg  Tabs (Lorazepam) .... One Tab By Mouth At Bedtime Prescribed By Mental Health 2)  Wellbutrin Xl 300 Mg  Tb24 (Bupropion Hcl) .... One Daily Per [facility] 3)  Hydrochlorothiazide 25 Mg  Tabs (Hydrochlorothiazide) .... Take 1 Tab By Mouth Every Morning 4)  Norvasc 10 Mg  Tabs (Amlodipine Besylate) .... One Tab By Mouth Daily 5)  Lotensin 40 Mg  Tabs (Benazepril Hcl) .... One Tab By Mouth Daily 6)  Neurontin 400 Mg Caps (Gabapentin) .... One Tab By Mouth At Bedtime Per Mental [facility] 7)  Metformin Hcl 1000 Mg Tabs (Metformin Hcl) .... Take 1 Tablet By Mouth Two Times A Day 8)  Lamictal 200 Mg Tabs (Lamotrigine) .... One Tab By Mouth Daily Per [facility] 9)  Seroquel Xr 300 Mg  Tb24 (Quetiapine Fumarate) .... 2 Tabs Qhs Per Mental  Health 10)  Simvastatin 40 Mg Tabs (Simvastatin) .... Take Two Tablets At Bedtime 11)  Premarin 0.625 Mg Tabs (Estrogens Conjugated) .... Take 1 Tab By Mouth Daily 12)  Tramadol Hcl 50 Mg Tabs (Tramadol Hcl) .... Take 1 Tab By Mouth Every 6 Hours As Needed For Pain  Allergies: 1)  Aspirin (Aspirin)  Past History:  Past Medical History: Reviewed history from 12/16/2007 and no changes required. 7/01 On Bipap for OSA Cushingoid appearance dizziness attributed to hypersomnolence HTN DM HL Knee arthritis  Social History: Reviewed history from 01/06/2007 and no changes required. lives in [place] w/ dtr. Has a total of 6 children and >20 grandchildren. Quit Smoking 4/07-<1/2 ppd.  No EtOH, drugs.  Physical Exam  General:  Obese,in no acute distress; alert,appropriate and cooperative throughout examination. Vitals reviewed Neck:  supple and no masses.   Lungs:  normal respiratory effort, no crackles, and no wheezes.   Heart:  normal rate and regular rhythm.   Extremities:  No clubbing, cyanosis, edema, or deformity noted   Psych:  alert and oriented. good eye contact, not anxious appearing, and not depressed appearing.     Impression & Recommendations:  Problem # 1:  DIABETES MELLITUS II,  UNCOMPLICATED (ICD-250.00) Assessment Unchanged reports good CBGs at home.  Check A1c today. Her updated medication list for this problem includes:    Lotensin 40 Mg Tabs (Benazepril hcl) ..... One tab by mouth daily    Metformin Hcl 1000 Mg Tabs (Metformin hcl) .Marland Kitchen... Take 1 tablet by mouth two times a day  Orders: [facility] ([address]) [facility]- Est  Level 4 ([address])  Problem # 2:  HYPERTENSION, BENIGN SYSTEMIC (ICD-401.1) Assessment: Unchanged  BP at goal.  Will not make any changes. Her updated medication list for this problem includes:    Hydrochlorothiazide 25 Mg Tabs (Hydrochlorothiazide) .Marland Kitchen... Take 1 tab by mouth every morning    Norvasc 10 Mg Tabs (Amlodipine besylate) ..... One tab by mouth  daily    Lotensin 40 Mg Tabs (Benazepril hcl) ..... One tab by mouth daily  Orders: [facility]- Est  Level 4 ([address])  Problem # 3:  OBESITY, NOS (ICD-278.00) Assessment: Improved  Has lost about 13 lbs since last visit.  Congratulated her on the recent success.  Continued to advise to cut out carbs and increase vegetables.  Orders: [facility]- Est  Level 4 ([address]1478)  Complete Medication List: 1)  Lorazepam 1 Mg Tabs (Lorazepam) .... One tab by mouth at bedtime prescribed by mental health 2)  Wellbutrin Xl 300 Mg Tb24 (Bupropion hcl) .... One daily per mental health 3)  Hydrochlorothiazide 25 Mg Tabs (Hydrochlorothiazide) .... Take 1 tab by mouth every morning 4)  Norvasc 10 Mg Tabs (Amlodipine besylate) .... One tab by mouth daily 5)  Lotensin 40 Mg Tabs (Benazepril hcl) .... One tab by mouth daily 6)  Neurontin 400 Mg Caps (Gabapentin) .... One tab by mouth at bedtime per mental health 7)  Metformin Hcl 1000 Mg Tabs (Metformin hcl) .... Take 1 tablet by mouth two times a day 8)  Lamictal 200 Mg Tabs (Lamotrigine) .... One tab by mouth daily per [facility] 9)  Seroquel Xr 300 Mg Tb24 (Quetiapine fumarate) .... 2 tabs qhs per mental health 10)  Simvastatin 40 Mg Tabs (Simvastatin) .... Take two tablets at bedtime 11)  Premarin 0.625 Mg Tabs (Estrogens conjugated) .... Take 1 tab by mouth daily 12)  Tramadol Hcl 50 Mg Tabs (Tramadol hcl) .... Take 1 tab by mouth every 6 hours as needed for pain  Laboratory Results   Blood Tests   Date/Time Received: May 02, 2010 10:02 AM  Date/Time Reported: May 02, 2010 10:11 AM   HGBA1C: 6.6%   (Normal Range: Non-Diabetic - 3-6%   Control Diabetic - 6-8%)  Comments: ...............test performed by.................Marland KitchenGaren Grams, LPN .............entered by...........Marland KitchenBonnie A. Swaz[place], MLS (ASCP)cm

## 2010-08-21 NOTE — Miscellaneous (Signed)
  Medications Added SIMVASTATIN 40 MG TABS (SIMVASTATIN) Take two tablets at bedtime       Clinical Lists Changes  Medications: Changed medication from SIMVASTATIN 40 MG TABS (SIMVASTATIN) Take one tablet at bedtime to SIMVASTATIN 40 MG TABS (SIMVASTATIN) Take two tablets at bedtime

## 2010-08-21 NOTE — Progress Notes (Signed)
Summary: Referral   Phone Note Call from Patient Call back at Home Phone [phone]   Reason for Call: Referral Summary of Call: Pt is requesting a referral to see Dr. Quintella Reichert for weight loss Initial call taken by: Knox Royalty,  June 22, 2010 2:07 PM  Follow-up for Phone Call        will forward to MD. Follow-up by: Theresia Lo RN,  June 22, 2010 2:13 PM  Additional Follow-up for Phone Call Additional follow up Details #1::        I'm not sure who that is.  If it is a weight loss clinic she can just call and schedule an appointment. Additional Follow-up by: Angelena Sole MD,  June 27, 2010 8:50 AM    Additional Follow-up for Phone Call Additional follow up Details #2::    message from MD given to patient. Follow-up by: Theresia Lo RN,  June 27, 2010 9:34 AM

## 2010-08-21 NOTE — Letter (Signed)
Summary: Generic Letter  [name]  [address]   [place], [place] [address]   Phone: [phone]  Fax: [phone]    10/04/2009  [facility] [address] [place], [place]  [address]  Dear Alicia [name],   Here is a copy of your lab results.  Overall your cholesterol was pretty good.  I would like for your bad (LDL) cholesterol to be <100 ideally.  Because it was 110 I think that we should increase the dose of your Simvastatin.  Please start taking two pills (80mg ) once a day.   Tests: (1) Lipid Profile ([address])   Order Note: FASTING   Cholesterol               193 mg/dL                   9-629     ATP III Classification:           < 200        mg/dL        Desirable          200 - 239     mg/dL        Borderline High          >= 240        mg/dL        High         Triglyceride              141 mg/dL                   <528   HDL Cholesterol           55 mg/dL                    >41   Total Chol/HDL Ratio      3.5 Ratio  VLDL Cholesterol (Calc)                             28 mg/dL                    3-24  LDL Cholesterol (Calc)                        [H]  110 mg/dL                   4-01           Total Cholesterol/HDL Ratio:CHD Risk                            Coronary Heart Disease Risk Table                                            Men       Women              1/2 Average Risk              3.4        3.3                  Average Risk  5.0        4.4              2 X Average Risk              9.6        7.1              3 X Average Risk             23.4       11.0     Use the calculated Patient Ratio above and the CHD Risk table      to determine the patient's CHD Risk.     ATP III Classification (LDL):           < 100        mg/dL         Optimal          100 - 129     mg/dL         Near or Above Optimal          130 - 159     mg/dL         Borderline High          160 - 189     mg/dL         High           > 190         mg/dL         Very High   Sincerely,   Alicia Sole MD  Appended Document: Generic Letter mailed.

## 2010-08-21 NOTE — Assessment & Plan Note (Signed)
Summary: flu shot/kh   Nurse Visit   Allergies: 1)  Aspirin (Aspirin)  Immunizations Administered:  Influenza Vaccine # 1:    Vaccine Type: Fluvax MCR    Site: right deltoid    Mfr: [employer]    Dose: 0.5 ml    Route: IM    Given by: Theresia Lo RN    Exp. Date: 01/16/2011    Lot #: [id number]    VIS given: 02/13/10 version given April 25, 2010.  Flu Vaccine Consent Questions:    Do you have a history of severe allergic reactions to this vaccine? no    Any prior history of allergic reactions to egg and/or gelatin? no    Do you have a sensitivity to the preservative Thimersol? no    Do you have a past history of Guillan-Barre Syndrome? no    Do you currently have an acute febrile illness? no    Have you ever had a severe reaction to latex? no    Vaccine information given and explained to patient? yes    Are you currently pregnant? no  Orders Added: 1)  Influenza Vaccine MCR [00025] 2)  Administration Flu vaccine - MCR [G0008]

## 2010-08-21 NOTE — Progress Notes (Signed)
Summary: phn msg   Phone Note Call from Patient Call back at Home Phone [phone]   Caller: Patient Summary of Call: fell on her knee last Thursday - is taking Tramadol that Lelon Perla had given her but it's not helping the pain.  needs something stronger Initial call taken by: De Nurse,  June 11, 2010 10:26 AM  Follow-up for Phone Call        This is a new issue.  If she needs something stronger she will need to come in for an office visit. Follow-up by: Angelena Sole MD,  June 12, 2010 9:25 AM     Appended Document: Oren Section LMOVM informing patient

## 2010-08-21 NOTE — Miscellaneous (Signed)
   Clinical Lists Changes  Problems: Removed problem of ENCOUNTER FOR LONG-TERM USE OF OTHER MEDICATIONS (ICD-V58.69) Removed problem of NEED PROPHYLACTIC VACCINATION&INOCULATION FLU (ICD-V04.81) Removed problem of NEED PROPHYLACTIC VACCINATION&INOCULATION FLU (ICD-V04.81) Removed problem of BACK PAIN, ACUTE (ICD-724.5) Removed problem of WELL ADULT EXAM (ICD-V70.0) Removed problem of OTHER SCREENING MAMMOGRAM (ICD-V76.12) Removed problem of UNSPECIFIED BREAST SCREENING (ICD-V76.10) Removed problem of MUSCULOSKELETAL PAIN (ICD-781.99) Removed problem of VIRAL URI (ICD-465.9) Removed problem of HX, PERSONAL, TOBACCO USE (ICD-V15.82) Removed problem of SCREENING FOR MALIGNANT NEOPLASM, COLON (ICD-V76.51) Removed problem of INSOMNIA NOS (ICD-780.52) Removed problem of BACK PAIN, LOW (ICD-724.2)
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
## 2010-08-21 NOTE — Miscellaneous (Signed)
Summary: Orders Update  Clinical Lists Changes  Problems: Added new problem of ENCOUNTER FOR LONG-TERM USE OF OTHER MEDICATIONS (ICD-V58.69) Orders: Added new Test order of B12-[facility] (82607-23330) - Signed Added new Test order of CBC-[facility] (85027) - Signed  OK per Dr. Saunders 

## 2010-08-21 NOTE — Consult Note (Signed)
[facility]   Imported By: Knox Royalty 06/29/2010 12:21:51  _____________________________________________________________________  External Attachment:    Type:   Image     Comment:   External Document

## 2010-08-21 NOTE — Letter (Signed)
Summary: Generic Letter  [name]  [address]   [place], [place] [address]   Phone: [phone]  Fax: [phone]    04/27/2010  [facility] [address] [place], [place]  [address]  Dear Ms. Greenwell,   I have been unable to reach you by phone. we need to make you an appointment asap. the md will not fill out the forms for your diabetes supplies unless he sees you. It has been several months since you were last here. please call us with a working number & make an appointment.       Sincerely,   Golden Circle RN

## 2010-08-21 NOTE — Miscellaneous (Signed)
Summary: needs appt   Clinical Lists Changes rec'd message from her dm supply place that she is due oct 13th. I called to make her an appt but the number has been d/c'd. I mailed her a letter. needs to see pcp.Golden Circle RN  April 27, 2010 4:37 PM

## 2010-08-21 NOTE — Assessment & Plan Note (Signed)
Summary: KNEE PAIN/[facility]   Vital Signs:  Patient profile:   60 year old female Height:      64.5 inches Weight:      249.7 pounds BMI:     42.35 Temp:     98.6 degrees F oral Pulse rate:   88 / minute BP sitting:   135 / 85  (left arm) Cuff size:   regular  Vitals Entered By: Garen Grams LPN (November 09, 2009 9:42 AM) CC: bilateral knee pain Is Patient Diabetic? Yes Did you bring your meter with you today? No Pain Assessment Patient in pain? yes     Location: knees   Primary Care Provider:  Angelena Sole MD  CC:  bilateral knee pain.  History of Present Illness: 1. Bilateral knee pain:  Pt with a hx of arthritis of both knees.  She has been seen by Orthopedic surgery who thinks that she needs knee replacement but won't do it until she loses weight.  Has been trying to go the gym and watch her diet.  Has lost 30 lbs since making lifestyle changes.  Her left knee is stable since she has gotten an orthopedic brace.  Her right knee is very painful and feels like it is locking.  She is requesting another brace for her right knee.  Habits & Providers  Alcohol-Tobacco-Diet     Tobacco Status: quit  Current Medications (verified): 1)  Lorazepam 1 Mg  Tabs (Lorazepam) .... One Tab By Mouth At Bedtime Prescribed By Mental Health 2)  Wellbutrin Xl 300 Mg  Tb24 (Bupropion Hcl) .... One Daily Per Mental [facility] 3)  Hydrochlorothiazide 25 Mg  Tabs (Hydrochlorothiazide) .... Take 1 Tab By Mouth Every Morning 4)  Norvasc 10 Mg  Tabs (Amlodipine Besylate) .... One Tab By Mouth Daily 5)  Lotensin 40 Mg  Tabs (Benazepril Hcl) .... One Tab By Mouth Daily 6)  Neurontin 400 Mg Caps (Gabapentin) .... One Tab By Mouth At Bedtime Per Mental [facility] 7)  Metformin Hcl 1000 Mg Tabs (Metformin Hcl) .... Take 1 Tablet By Mouth Two Times A Day 8)  Lamictal 200 Mg Tabs (Lamotrigine) .... One Tab By Mouth Daily Per [facility] 9)  Seroquel Xr 300 Mg  Tb24 (Quetiapine Fumarate) .... 2 Tabs Qhs Per Mental  Health 10)  Simvastatin 40 Mg Tabs (Simvastatin) .... Take Two Tablets At Bedtime 11)  Premarin 0.625 Mg Tabs (Estrogens Conjugated) .... Take 1 Tab By Mouth Daily 12)  Tramadol Hcl 50 Mg Tabs (Tramadol Hcl) .... Take 1 Tab By Mouth Every 6 Hours As Needed For Pain  Allergies: 1)  Aspirin (Aspirin)  Past History:  Past Medical History: Reviewed history from 12/16/2007 and no changes required. 7/01 On Bipap for OSA Cushingoid appearance dizziness attributed to hypersomnolence HTN DM HL Knee arthritis  Social History: Reviewed history from 01/06/2007 and no changes required. lives in [place] w/ dtr. Has a total of 6 children and 16 grandchildren. Quit Smoking 4/07-<1/2 ppd.  No EtOH, drugs.Smoking Status:  quit  Physical Exam  General:  Obese,in no acute distress; alert,appropriate and cooperative throughout examination. Vitals reviewed Lungs:  normal respiratory effort, no crackles, and no wheezes.   Heart:  normal rate and regular rhythm.   Msk:  bilat chronic DJD changes LT knee is painful on full extension RT knee gets full extension with creptiation under patella TTP along  med jt line bilat   Impression & Recommendations:  Problem # 1:  KNEE PAIN, CHRONIC (ICD-719.46) Assessment Deteriorated  Will  continue Tramadol.  Filled out form for a knee brace for her right knee. Her updated medication list for this problem includes:    Tramadol Hcl 50 Mg Tabs (Tramadol hcl) .Marland Kitchen... Take 1 tab by mouth every 6 hours as needed for pain  Orders: [facility]- Est Level  3 ([address])  Complete Medication List: 1)  Lorazepam 1 Mg Tabs (Lorazepam) .... One tab by mouth at bedtime prescribed by mental health 2)  Wellbutrin Xl 300 Mg Tb24 (Bupropion hcl) .... One daily per mental health 3)  Hydrochlorothiazide 25 Mg Tabs (Hydrochlorothiazide) .... Take 1 tab by mouth every morning 4)  Norvasc 10 Mg Tabs (Amlodipine besylate) .... One tab by mouth daily 5)  Lotensin 40 Mg Tabs (Benazepril hcl)  .... One tab by mouth daily 6)  Neurontin 400 Mg Caps (Gabapentin) .... One tab by mouth at bedtime per mental health 7)  Metformin Hcl 1000 Mg Tabs (Metformin hcl) .... Take 1 tablet by mouth two times a day 8)  Lamictal 200 Mg Tabs (Lamotrigine) .... One tab by mouth daily per metnal health 9)  Seroquel Xr 300 Mg Tb24 (Quetiapine fumarate) .... 2 tabs qhs per mental health 10)  Simvastatin 40 Mg Tabs (Simvastatin) .... Take two tablets at bedtime 11)  Premarin 0.625 Mg Tabs (Estrogens conjugated) .... Take 1 tab by mouth daily 12)  Tramadol Hcl 50 Mg Tabs (Tramadol hcl) .... Take 1 tab by mouth every 6 hours as needed for pain

## 2010-10-17 ENCOUNTER — Encounter: Payer: Self-pay | Admitting: Family Medicine

## 2010-10-17 ENCOUNTER — Ambulatory Visit (INDEPENDENT_AMBULATORY_CARE_PROVIDER_SITE_OTHER): Payer: Medicare Other | Admitting: Family Medicine

## 2010-10-17 DIAGNOSIS — I10 Essential (primary) hypertension: Secondary | ICD-10-CM

## 2010-10-17 DIAGNOSIS — M1710 Unilateral primary osteoarthritis, unspecified knee: Secondary | ICD-10-CM

## 2010-10-17 DIAGNOSIS — F17200 Nicotine dependence, unspecified, uncomplicated: Secondary | ICD-10-CM

## 2010-10-17 DIAGNOSIS — F329 Major depressive disorder, single episode, unspecified: Secondary | ICD-10-CM

## 2010-10-17 DIAGNOSIS — E119 Type 2 diabetes mellitus without complications: Secondary | ICD-10-CM

## 2010-10-17 LAB — POCT GLYCOSYLATED HEMOGLOBIN (HGB A1C): Hemoglobin A1C: 6.4

## 2010-10-17 MED ORDER — NICOTINE 7 MG/24HR TD PT24: 1.0000 | MEDICATED_PATCH | TRANSDERMAL | Status: DC

## 2010-10-17 MED ORDER — NAPROXEN 500 MG PO TABS: 500.0000 mg | ORAL_TABLET | Freq: Two times a day (BID) | ORAL | Status: DC

## 2010-10-17 NOTE — Assessment & Plan Note (Signed)
Still smoking.  Encouraged her to quit.  She wants to quit.  Wrote for patches.

## 2010-10-17 NOTE — Assessment & Plan Note (Signed)
Blood pressure improved since restarting her medications.  Now at goal.

## 2010-10-17 NOTE — Assessment & Plan Note (Signed)
Stable. No changes today. 

## 2010-10-17 NOTE — Assessment & Plan Note (Signed)
No acute findings.  Treat with Naproxen.

## 2010-10-17 NOTE — Progress Notes (Signed)
  Subjective:    Patient ID: Alicia [name], female    DOB: 1951-04-10, 60 y.o.   MRN: [id number]  HPI 1. HTN:  Pt is taking her medications as prescribed.  She doesn't check her blood pressure regularly.  2. DMII:  Pt is taking her medications as prescribed.  When she does check her CBGs it is around 114-150  3. Depression:  Pt is still dealing with a lot of family issues.  She feels stuck taking care of her grandson because her daughter is out "having fun with her man".  Overall her mood is stable though  4. Knee pain:  This is getting a little worse.  For the most part it is well controlled but she has times when it really hurts  5. Tobacco use:  Continues to smoke.  Smokes 1 pack in 3 days.  Review of Systems Denies chest pain, shortness of breath.  Denies vision changes.  Denies SI.  Denies knee catching or locking    Objective:   Physical Exam  Constitutional: She appears well-nourished. No distress.       obese  Eyes:       Normal fundoscopic  Neck: Normal range of motion. Neck supple.  Cardiovascular: Normal rate, regular rhythm and normal heart sounds.   Pulmonary/Chest: Effort normal and breath sounds normal. No respiratory distress. She has no wheezes.  Abdominal: Soft.  Musculoskeletal: She exhibits no edema.       Moderate joint line tenderness  Psychiatric: She has a normal mood and affect.          Assessment & Plan:

## 2010-10-17 NOTE — Assessment & Plan Note (Signed)
HgA1C improved.  She has been back to the gym and has been watching her diet.  Continue current regimen.

## 2010-10-17 NOTE — Patient Instructions (Signed)
Everything seems to be doing pretty good Your blood pressure and your blood sugar are both under good control.  We will not make any medication changes today I can tell that you are getting frustrated with your family situation.  Remember that you also need to take care of yourself Please come back and see me in June

## 2010-11-10 ENCOUNTER — Emergency Department (HOSPITAL_COMMUNITY)
Admission: EM | Admit: 2010-11-10 | Discharge: 2010-11-10 | Disposition: A | Discharge status: Home / Self Care | Payer: No Typology Code available for payment source | Attending: Emergency Medicine | Admitting: Emergency Medicine

## 2010-11-10 DIAGNOSIS — S8000XA Contusion of unspecified knee, initial encounter: Secondary | ICD-10-CM | POA: Insufficient documentation

## 2010-11-10 DIAGNOSIS — M545 Low back pain, unspecified: Secondary | ICD-10-CM | POA: Insufficient documentation

## 2010-11-10 DIAGNOSIS — S335XXA Sprain of ligaments of lumbar spine, initial encounter: Secondary | ICD-10-CM | POA: Insufficient documentation

## 2010-11-10 DIAGNOSIS — M25569 Pain in unspecified knee: Secondary | ICD-10-CM | POA: Insufficient documentation

## 2010-11-10 DIAGNOSIS — I509 Heart failure, unspecified: Secondary | ICD-10-CM | POA: Insufficient documentation

## 2010-11-10 DIAGNOSIS — E119 Type 2 diabetes mellitus without complications: Secondary | ICD-10-CM | POA: Insufficient documentation

## 2010-11-10 DIAGNOSIS — I10 Essential (primary) hypertension: Secondary | ICD-10-CM | POA: Insufficient documentation

## 2010-11-16 ENCOUNTER — Encounter: Payer: Self-pay | Admitting: Family Medicine

## 2010-11-16 ENCOUNTER — Ambulatory Visit (INDEPENDENT_AMBULATORY_CARE_PROVIDER_SITE_OTHER): Payer: Self-pay | Admitting: Family Medicine

## 2010-11-16 DIAGNOSIS — M25569 Pain in unspecified knee: Secondary | ICD-10-CM

## 2010-11-16 DIAGNOSIS — S46819A Strain of other muscles, fascia and tendons at shoulder and upper arm level, unspecified arm, initial encounter: Secondary | ICD-10-CM | POA: Insufficient documentation

## 2010-11-16 DIAGNOSIS — M25562 Pain in left knee: Secondary | ICD-10-CM

## 2010-11-16 DIAGNOSIS — S43499A Other sprain of unspecified shoulder joint, initial encounter: Secondary | ICD-10-CM

## 2010-11-16 DIAGNOSIS — I10 Essential (primary) hypertension: Secondary | ICD-10-CM

## 2010-11-16 NOTE — Assessment & Plan Note (Signed)
Normal exam.  Likely muscle sprain.  Continue Robaxin and Ibuprofen.  Precautions given for worsening of symptoms.
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
## 2010-11-16 NOTE — Assessment & Plan Note (Signed)
BP elevated today but improved on recheck.  She didn't take her medicines today and she has been under a lot of stress.  Not interested in making medication changes today.

## 2010-11-16 NOTE — Assessment & Plan Note (Signed)
Benign exam.  Slightly antalgic gait.  No signs of intraarticular damage or ligament injury.  Continue with pain control.  Precautions given.

## 2010-12-07 NOTE — H&P (Signed)
Alicia [name], Alicia [name] NO.:  [id number]   MEDICAL RECORD NO.:  [id number]          PATIENT TYPE:  INP   LOCATION:  3705                         FACILITY:  [facility]   PHYSICIAN:  Pearlean Brownie, M.D.DATE OF BIRTH:  [birth year]   DATE OF ADMISSION:  09/12/2005  DATE OF DISCHARGE:                                HISTORY & PHYSICAL   PRIMARY CARE PHYSICIAN:  Dr. Raquel James   CHIEF COMPLAINT:  Chest pain.   HISTORY OF PRESENT ILLNESS:  Patient is a pleasant 60 year old African-  American female with one episode of left substernal chest pain/pressure  today.  It started when the patient was at rest this afternoon.  She did  have pain radiating into the neck and into her left arm.  She came to the  emergency room with continued symptoms.  At time of examination she was  chest pain-free after receiving nitroglycerin.  She had no nausea and  vomiting, no cough, no changes in p.o. intake.  She had no previous similar  symptoms.  She also had no syncope nor presyncope.   REVIEW OF SYSTEMS:  No fevers.  No chills.  CARDIOVASCULAR:  As above.  PULMONARY:  No shortness of breath.  SKIN:  No rash.  GU:  No changes in  urination.  GI:  No nausea and vomiting.   PAST MEDICAL HISTORY:  1.  Back pain.  2.  Diabetes.  3.  Obesity.  4.  Schizophrenia.  5.  Hypertension.  6.  Cor pulmonale.  7.  CHF with an ejection fraction of greater than 50%.  8.  Insomnia.  9.  Sleep apnea.  10. Tobacco abuse.  11. Depression.  12. Need for BiPAP.  13. Dizziness attributed to hypersomnolence.  14. She had a negative Cardiolite in 2001.   FAMILY HISTORY:  Noted for a mother with diabetes and hypertension.  Her  father has coronary artery disease and hypertension.   SOCIAL HISTORY:  She lives in [place] with a daughter.  She has four  children.  She smokes about a half a pack per day.  No alcohol.  No drugs.   MEDICATIONS:  1.  Diclofenac 100 mg SR p.o. q.a.m.  2.  Glucophage 500  mg p.o. q.a.m.  3.  Hydrochlorothiazide 25 mg p.o. daily.  4.  Lorazepam 1 mg p.o. q.h.s.  5.  Lotensin 40 mg p.o. daily.  6.  Neurontin 400 mg p.o. q.h.s.  7.  Premarin 1.25 mg p.o. daily.  8.  Protonix 40 mg p.o. daily.  9.  Seroquel 600 mg p.o. q.a.m.  10. Wellbutrin 300 mg p.o. daily.   The patient has not yet taken her medications for today.   ALLERGIES:  ASPIRIN which causes facial swelling.   PHYSICAL EXAMINATION:  VITAL SIGNS:  Patient has temperature 98.2,  respiratory rate 22, pulse 103 though this was decreased into the normal  range when I was examining the patient.  Blood pressure 135/71, O2  saturations 95% on room air.  GENERAL:  She is in no apparent distress.  She is obese and lying on the  stretcher in the hallway of the  ED.  Mental status:  Alert and oriented x3.  HEENT:  Head:  Normocephalic, atraumatic.  Eyes:  Pupils are equal, round,  and reactive to light.  Mouth and throat:  Mucous membranes are moist.  CHEST:  Clear to auscultation bilaterally.  No wheezes.  No rhonchi.  No  crackles.  This is limited by body habitus.  Chest wall was nontender to  palpation.  No rashes appreciated on chest wall.  HEART:  Regular rate and rhythm.  No murmurs, rubs, or gallops appreciated.  ABDOMEN:  Soft, nontender, nondistended.  Positive bowel sounds.  Obese.  No  rebound.  No guarding.  EXTREMITIES:  No edema noted.  Pulses 2+ dorsalis pedis and 2+ radial pulses  bilaterally.  GENITAL/RECTAL:  Fecal occult blood negative.  Rectal tone within normal  limits.  NEUROLOGIC:  Cranial nerves II-XII intact.  Strength:  Motor and sensation  is grossly intact to the bilateral upper and lower extremities.  Gait was  not tested.  BACK:  She does have lumbar paraspinal complex which is tender to palpation  on her left side.  This is at baseline per the patient.   LABORATORIES:  Hemoglobin 13.9, hematocrit 41, white count 8.2, platelets  296.  Differential 52% neutrophils, 40%  lymphocytes.  I-stat electrolytes:  Sodium 136, potassium 3.4, chloride 101, bicarbonate 31.8, BUN 9, creatinine  0.8, glucose 157.  Point of care enzymes are negative x1.  Chest x-ray  showing no acute disease.  Fecal occult blood test is negative.  EKG shows  no acute ST elevation.  The patient does have some nonspecific T-wave  inversion in the lateral leads when compared to a chest x-ray from 2001.   ASSESSMENT/PLAN:  Patient is a 60 year old female with the following  problems.  1.  Chest pain.  The differential includes the following:  Cardiac.  She did      have decrease in symptoms after nitroglycerin.  I will admit her and      rule out with enzymes and EKGs.  I will check a TSH, BNP, and follow her      blood pressure.  Will use nitroglycerin for chest pain if it reoccurs.      Will also modify risk factors.  Will follow with fasting lipid panel and      her A1c.  Also in the differential includes pulmonary.  I have no reason      to think she has a pneumothorax nor a pneumonia.  She is in the lowest      risk for deep venous thrombosis/pulmonary embolism.  Therefore, I will      not check a D-dimer.  Musculoskeletal:  This is unlikely as it is not      reproducible.  GI:  this is possible with a the decrease in symptoms as      the nitroglycerin.  It is possible that esophageal spasm could have      caused these symptoms.  Will place the patient on a PPI and follow her.  2.  Zoster is unlikely that patient has no rash and no tingling sensation in      a nerve root distribution.  3.  Psychiatric issues are in the differential; however, she has no symptoms      of panic and will continue her home medications.  4.  Diabetes.  Will hold her Metformin in case she needs to have imaging      done and cover her with sliding scale  insulin.  Will check an A1c.  5.  Hypertension.  Will follow blood pressure and hold her ACE inhibitor in     case of need to image.  6.  Psychiatric.   Continue her home medications which include Neurontin,      lorazepam, Seroquel, and Wellbutrin.  7.  Back pain.  Will give her Tylenol and follow this.  There is no acute      need for imaging of her lumbar spine since she has no bowel or no      bladder incontinence.  She has no saddle anesthesia.  8.  Deep venous thrombosis prophylaxis.  Lovenox.  9.  Gastrointestinal.  Place the patient on PPI and give her a      diabetic/heart healthy diet.  10. Code status full.  Place the patient on BiPAP at 10/5 for sleep.  11. Intravenous fluid and electrolytes.  Hepatitis-Lock her intravenous and      follow with a BMET in the morning.   DISPOSITION:  Admit.      Dwana Curd Para March, M.D.    ______________________________  Pearlean Brownie, M.D.    GSD/MEDQ  D:  09/12/2005  T:  09/12/2005  Job:  [id number]   cc:   Ursula Beath, MD  Fax: [phone]

## 2010-12-07 NOTE — Consult Note (Signed)
[place]. [facility]  Patient:    Alicia [name], Alicia [name]                    MRN: [id number] Proc. Date: 10/16/99 Attending:  Meade Maw, M.D.                          Consultation Report  INDICATION FOR CONSULTATION:  Shortness of breath and cramping in the chest.  HISTORY:  Alicia [name] is a 60 year old African-American who was admitted n October 12, 1999 with chief complaint of cramping in her chest associated with increased pedal edema and shortness of breath.  Admitting diagnoses included: #1 - New-onset CHF; #2 - left lower extremity; #3 - mild chest pain; #4 - mental illness.  She was admitted to the medicine service.  Myocardial infarction was ruled out by cardiac enzymes.  Her troponin I was less than 0.003.  Her CK on admission was 46, next CK was 500; questionable accuracy of this result as the following CK again dropped down to 75.  Troponin was less than 0.003.  ECG on admission revealed a normal sinus rhythm.  There were nonspecific ST changes, primarily in the precordial leads.  A transesophageal echo was obtained.  The results were personally reviewed by myself and there was anterior hypokinesis; however, this regional wall motion abnormality is difficult to call secondary to marked RV enlargement and right ventricular pressure overload.  Moderate tricuspid regurgitation with pulmonary arterial systolic pressure of 45 with moderate-to-marked right atrial enlargement and RV enlargement were noted.  The  echo was suboptimal secondary to body habitus.  The patient was appropriately diuresed upon admission and symptomatically, her dyspnea improved; she continues to complain, however, of dyspnea.  REVIEW OF SYSTEMS:  There has been no syncope and no presyncope.  She continues to have an occasional headache.  She complains of some orthopnea; occasional nonproductive cough.  There has been no bright red blood per rectum and  no melenic stools noted.  No hemoptysis is noted.  States that she is mentally competent for management of her own medical decisions.  PAST MEDICAL HISTORY: 1. Schizophrenic. 2. Depression. 3. Insomnia. 4. Disoriented identity.  PAST SURGICAL HISTORY: 1. Hysterectomy in 1991. 2. Carpal tunnel syndrome.  CURRENT MEDICATIONS: 1. Seroquel. 2. Neurontin. 3. Prozac 40 mg q.d. 4. Lopressor 25 mg b.i.d. 5. Lorazepam 2 mg p.o. b.i.d. 6. Premarin 0.625 mg q.d. 7. Kay-Ciel sliding scale. 8. Lasix 40 mg p.o. q.d. 9. Tylenol 650 mg p.o. q.4h. p.r.n.  ALLERGIES:  As related to ASPIRIN.  PHYSICAL EXAMINATION:  GENERAL:  Physical exam reveals a middle-aged African-American lying flat in bed in no acute distress.  VITAL SIGNS:  Telemetry was reviewed and reveals a sinus rhythm.  There has been no ectopy since her admission.  Her systolic blood pressure is 118/60.  Her O2 saturation is 96% on room air.  She is afebrile.  HEENT:  Unremarkable.  NECK:  There is no neck vein distention noted.  PULMONARY:  Breath sounds which are equal and clear to auscultation.  CARDIOVASCULAR:  Normal S1 and normal S2, a regular rate and rhythm.  There is  3/6 systolic murmur noted at the left upper sternal border.  Unable to palpate he PMI secondary to body habitus.  ABDOMEN:  Obese.  There are no obvious bruits.  No obvious pulsations noted; however, exam is difficult secondary to habitus.  EXTREMITIES:  Extremities do not reveal any peripheral edema.  NEUROLOGIC:  She is alert.  Conversation appears appropriate.  SKIN:  Warm and dry.  LABORATORY DATA:  As noted above.  Myocardial infarction ruled out by serial cardiac enzymes.  ECG reveals a normal sinus rhythm.  Nonspecific ST changes are noted in the precordial leads.  Her electrolytes are within normal limits.  Glucose is elevated at 178. Creatinine is at 0.8.  Hematocrit is 34 on admission; this has not been repeated  since admission.  Potassium is 3.5.  Her LDL is 76.  HDL is 39.  TSH is 1.68 and is within normal limits.  IMPRESSION: 1. Dyspnea.  This may be secondary to moderate pulmonary hypertension versus    ischemia.  Would obtain an adenosine Cardiolite to evaluate for reversible    ischemia; this was discussed with both the patient and her son.  The patient is    agreeable for left heart catheterization should the adenosine Cardiolite reveal    possible ischemia. 2. Tobacco abuse.  Would obtain a pulmonary function test as a possibility for er    dyspnea.  Her chronic pedal edema may be secondary to her morbid obesity. 3. Pulmonary hypertension.  Other etiologies to consider are collagen vascular    disease versus a congenital shunt.  Should the above workup prove to be    negative, may consider a transesophageal echo to evaluate for congenital shunts.    Diuretics should be continued as indicated for symptomatic relief and the    patient should be instructed to avoid isometric exercise.  I will discuss the patient with you further. DD:  10/16/99 TD:  10/16/99 Job: [id number]

## 2010-12-07 NOTE — Op Note (Signed)
[place]. [facility]  Patient:    Alicia [name], Alicia [name] Visit Number: [id number] MRN: [id number]          Service Type: DSU Location: [facility] 2899 23 Attending Physician:  Karenann Cai Dictated by:   Marya Landry Carlyle Lipa., M.D. Admit Date:  02/01/2002 Discharge Date: 02/01/2002                             Operative Report  PREOPERATIVE DIAGNOSIS:  Immature cataract, left eye.  POSTOPERATIVE DIAGNOSIS:  Immature cataract, left eye.  OPERATION PERFORMED:  Kelman phacoemulsification of cataract, left eye.  SURGEON:  Marya Landry. Carlyle Lipa., M.D.  ANESTHESIA:  Local using Xylocaine 2% with Marcaine 0.75% and Wydase.  INDICATIONS FOR PROCEDURE:  The patient is a 60 year old lady who has had a cataract extracted from the right eye.  She still complains of blurring of vision with difficulty seeing to read.  She was evaluated and found to have a visual acuity best corrected to 20/40 on the right and 20/60 on the left. There was an intraocular lens present on the right with a posterior subcapsular lens present on the left.  Cataract extraction with intraocular lens implantation was recommended.  She is admitted at this time for that purpose.  DESCRIPTION OF PROCEDURE:  Under the influence of IV sedation, the Van Lint akinesia and retrobulbar anesthesia was given.  The patient was prepped and draped in the usual manner.  The lid speculum was inserted under the upper and lower lid of the left eye and a 4-0 silk traction suture was passed through the belly of the superior rectus muscle for traction.  A fornix based, conjunctival flap was turned and hemostasis achieved by using cautery.  An incision was made in the sclera approximately 1 mm posterior to the limbus. This incision was dissected down to clear cornea using a crescent blade.  A side port incision was made at the 1:30 oclock position and OcuCoat was injected into the eye through the  side port incision.  An anterior capsulotomy was done using a bent 25 gauge needle.  The nucleus was hydrodissected using Xylocaine.  The [facility] phacoemulsification handpiece was passed into the eye and the nucleus was removed from the eye without difficulty.  The residual cortical material was aspirated.  The posterior capsule was polished using the olive tip polisher.  The wound was widened slightly and a foldable silicone lens was inserted into the eye behind the iris without difficulty.  The anterior chamber was reformed and the pupil was constricted using Miochol. The residual OcuCoat was aspirated from the eye.  The wound was then closed by using a single horizontal suture of 10-0 nylon.  After ascertaining that the wound was airtight and watertight, the conjunctiva was closed using thermal cautery.  1 cc of Celestone and 0.5 cc of gentamicin were injected subconjunctivally.  Maxitrol ophthalmic ointment and Pilopine ointment were applied along with a patch and Fox shield.  The patient tolerated the procedure well and discharged in satisfactory condition with instructions to rest today, to take Vicodin every fours as needed for pain and to see me in the office tomorrow for further evaluation.  DISCHARGE DIAGNOSES:  Immature cataract left eye. Dictated by:   Marya Landry Carlyle Lipa., M.D. Attending Physician:  Karenann Cai DD:  02/05/02 TD:  02/10/02 Job: [id number]

## 2010-12-07 NOTE — H&P (Signed)
[place]. [facility]  Patient:    Alicia [name], Alicia [name]                    MRN: [id number] Adm. Date:  [id number] Attending:  Sanjuana Letters Dictator:   Lyndee Leo. Foreman                         History and Physical  PROBLEM LIST:  1. New onset congestive heart failure.  2. Lower extremity edema.  3. Mild chest pain.  4. Mental illness.  CHIEF COMPLAINT:  Feet swelling and pain in feet.  HISTORY OF PRESENT ILLNESS: Alicia [name] is a 60 year old African-American female with a long history of lower extremity edema.  For the last two weeks the swelling has worsened and she now has increasing pain in her feet and states she cannot walk.  Also complaining of some worsening shortness of breath.  PAST MEDICAL HISTORY: 1. Schizophrenia.  2. Depression.  3. Insomnia. 4. Disoriented identity, "Alicia [name]".  PAST SURGICAL HISTORY:  Hysterectomy in 1991 secondary to fibroids, carpal tunnel release.  MEDICATIONS:  1. Neurontin 400 mg 3 capsules p.o. q.h.s.  2. Seroquel 100 mg 1 in a.m., 2 in p.m.  3. Prozac 20 mg 1 q.a.m., 2 q.p.m.  4. Premarin 0.625 mg p.o. q.d.  5. Lorazepam 2 mg p.o. b.i.d.  ALLERGIES:  ASPIRIN.  SOCIAL HISTORY:  The patient is divorced, with six children.  [place]  Disabled x 5 years secondary to mental illness.  The patient smokes one pack of cigarettes every two days.  Denies alcohol.  Denies drug use.  FAMILY HISTORY:  Positive for hypertension and diabetes in her mother and father. Also family history of cancer, unsure of which type.  REVIEW OF SYSTEMS:  Positive for headaches.  The patient wears glasses and has ad mild chest pain, dyspnea, orthopnea, edema in lower extremities, occasional cough. No abdominal pain, no dysuria, no bright red blood per rectum, no melena.  PHYSICAL EXAMINATION:  VITAL SIGNS:  Temperature 97.9 degrees, blood pressure 122/74, pulse 94, respirations 22.  Oxygen  saturation 99% on room air.  GENERAL:  This is an obese black female in no acute distress.  HEENT:  Head normocephalic, atraumatic.  PERRL.  Sclerae nonicteric.  Poor dentition.  Oropharynx clear.  NECK:  Supple, no thyromegaly, no JVD.  CARDIOVASCULAR:  Regular rate and rhythm.  BACK: No CVA tenderness.  No sacral edema.  ABDOMEN:  Obese, nontender.  Positive bowel sounds.  RECTAL:  Hemoccult negative.  EXTREMITIES:  There is 2+ pitting edema going up to her knees.  Pulses are 2+ bilaterally in upper and lower extremities.  Deep tendon reflexes 2+.  NEUROLOGIC: The patient is alert and oriented x 3.  Cranial nerves 2-12 grossly  intact.  LABORATORY DATA:  Chest x-ray shows mild pulmonary edema and cardiomegaly.  EKG  reveals normal sinus rhythm with nonspecific T wave changes.  Urinalysis is negative.  ASSESSMENT/PLAN:  The patient is a 60 year old African-American female with new  onset congestive heart failure.  1. Cardiac.  The patient presents with new onset mild congestive heart failure,     and will admit and rule out for myocardial infarction.  Repeat EKG in the     morning.  Will start her on metoprolol.  Continue with Lasix, she received     first dose in emergency department.  Discuss with cardiology to see if further  work-up is needed after enzymes return.  Will check EKG in the morning. Also     check her lipids, TSH, and CMET.  2. Mental illness.  The patient has a long psychiatric history.  She is followed     at the mental health clinic.  Her alter personality is named Alicia [name].  The     patient describes her as the happy one; however, she does tell the patient o     kill herself and her children.  The patient denies ever trying to harm herself     or her children.  Alicia [name] comes, "every now and then".  The last time was one     week ago.  The patient says Alicia [name] is not here at this time.  She is not     thinking about her hurting herself or anyone  else.  The patient is already     being followed at the mental health clinic and will hold off on psychiatric     consultation at this time.  Will continue her psychiatric medications in-house.  3. Possible diabetes mellitus.  Awaiting CMET.  Will also check a hemoglobin A1C     given the patients history of diabetes mellitus in both of her parents and er     current body habitus and history. DD:  10/12/99 TD:  10/12/99 Job: [id number]

## 2010-12-07 NOTE — Discharge Summary (Signed)
[place]  Patient:    Alicia [name], Alicia [name]                    MRN: [id number] Adm. Date:  [id number] Disc. Date: 10/18/99 Attending:  Sanjuana Letters                           Discharge Summary  DATE OF BIRTH:  05/28/1951  DISCHARGE DIAGNOSES: 1. Right-sided heart failure/pulmonary hypertension. 2. Sleep apnea. 3. Leg edema. 4. Schizophrenia. 5. Incontinence. 6. Obesity. 7. Smoking.  DISCHARGE MEDICATIONS: 1. Seroquel 100 mg q.a.m., 200 q.p.m. 2. Neurontin 1200 mg q.h.s. 3. Prozac 20 mg q.a.m., 40 mg q.h.s. 4. Ativan 2 mg b.i.d. 5. Premarin 0.625 q.d. 6. Hydrochlorothiazide 25 mg q.d. 7. CPAP machine overnight.  CONSULTATIONS:  Dr. Meade Maw, cardiology.  IMPRESSION: 1. Dyspnea.  This may be secondary to moderate pulmonary hypertension versus    ischemia.  Recommended adenosine Cardiolite. 2. Tobacco abuse.  Recommended pulmonary function tests. 3. Pulmonary hypertension.  Etiologies to consider are cerebrovascular disease    versus a congenital shunt recommendation.  If Cardiolite and pulmonary function    tests are within normal limits, consider a transesophageal echocardiogram to    evaluate for congenital shunts.  Diuretics should be continued as indicated or    symptomatic relief and the patient should be instructed to avoid isometric    exercise.  PROCEDURES:  EKG on March 23 showing normal sinus rhythm with nonspecific T-wave abnormality.  Echocardiogram on October 12, 1999, significant findings - left ventricle within normal size, left ventricular ejection fraction was normal. There is a flattened septum consistent with right ventricular pressure and volume overload.  Left atrium is moderately dilated.  Right ventricle is moderately dilated, and the right ventricular ejection fraction is moderately reduced. Right atrium is mildly dilated inferiorly and dilated inferior vena cava suggests right atrial  pressure.  Aortic root is normal.  No pericardial effusion.  Mild aortic  sclerosis with good valvular opening in the aortic valve.  No regurgitation. Mitral valve is normal in structure and function.  Tricuspid valve is normal in  structure and function.  There is a moderate tricuspid regurgitation estimated t 2+.  The right ventricular system systolic pressure is elevated 50 to 60 mmHG and is consistent with moderate to severe pulmonary hypertension.  The pulmonic valve was not well visualized.  There was no pulmonic valvular regurgitation.  INTERPRETATION:  A two-dimensional transthoracic echocardiogram with M mode and  Doppler performed, technically difficult.  The left ventricle was seen adequately. Ejection fraction was 60%.  Some septal flattening.  Right ventricle is seen less well but they have suggested a dilatation of the right ventricle and decreased function.  Patients basic metabolic panel was within normal limits.  LABORATORY DATA:  Lipid profile:  Total cholesterol 131, triglycerides 82, HDL 9, LDL 76, total cholesterol to HDL ratio 3.4.  Patients cardiac enzymes, the first set, were normal.  CK was 4.6, CK MB 0.4.  Second set, CK was 75, CK-MB was 1.2. Troponin I was less than 0.03.  Third set, CK was 500, CK-MB was ______ and troponin I was less than 0.03.  Patients PT 13.3, INR 1.1, and PTT 31 upon admission.  Patients urine was within normal limits on admission.  Patients iron studies on admission:  Total iron was 50, total iron binding capacity was 418.  Percent saturation was 12.  B12 level  was 606.  RBC folate was 386.  Patients glycosylated hemoglobin was 6.2.  HISTORY OF PRESENT ILLNESS:  Patient is a 60 year old African-American woman with a long history of lower extremity edema and, for the previous two weeks, the swelling has worsened and now has increased pain in both feet and cannot walk, as well as worsening shortness of breath, as well as a  cramping in her chest.  Patient was  admitted with problem list of new onset CHF with orthopnea, lower extremity edema, mild chest pain, psychiatric illness.  HOSPITAL COURSE:  Patient was admitted and a myocardial infarction was ruled out by cardiac enzymes.  EKG on admission revealed a normal sinus rhythm with nonspecific ST changes.  A transesophageal echocardiogram was obtained and there was an anterior hypokinesis.  However, the wall motion abnormality was difficult to call secondary to right ventricular enlargement and right ventricular pressure overload, mild tricuspid regurgitation with pulmonary arterial systolic pressure of 45 with moderate to marked right arterial enlargement and rapid ventricular enlargement  were noted.  The echocardiogram was suboptimal secondary to body habitus.  As the patient was appropriately diuresed upon admission, symptomatically, her dyspnea  dyspnea did improve during the hospital stay though patient continued to complain of dyspnea until the day of discharge.  Cardiac consult recommended both pulmonary function tests as well as an adenosine Cardiolite.  Patient continued to have mild shortness of breath and mild chest cramping.  Patient completed adenosine Cardiolite study and was found to have no significant abnormalities.  Patient also had pulmonary function tests which also indicated no significant abnormalities. At the recommendation of cardiology, patient was changed from metoprolol and Lasix to 25 mg p.o. q.d. hydrochlorothiazide.  On the night before discharge, patient was given a CPAP machine with the thought that the pulmonary hypertension could be secondary to sleep apnea as it did not seem to have cardiac origin.  The patient reported on the morning of discharge that the CPAP machine was extremely helpful for her sleep and her shortness of breath.  Cardiology recommended patient be discharged and follow up with them in  three months with the possibility of a TEE to look for congenital shunts as well as possibly digoxin as added medication if the patients symptoms returned while using CPAP at home.  Patient was discharged with  a new CPAP machine which her insurance will cover and is scheduled to follow up  with Dr. Cornelius Moras, her primary care physician, at the family practice clinic at [facility] on April 25.  Patient will also follow up for her psychiatric care with her psychiatrist at her already scheduled appointment in June. DD:  10/18/99 TD:  10/18/99 Job: [id number]

## 2010-12-07 NOTE — Consult Note (Signed)
Alicia [name], Alicia [name] NO.:  [id number]   MEDICAL RECORD NO.:  [id number]          PATIENT TYPE:  INP   LOCATION:  3705                         FACILITY:  [facility]   PHYSICIAN:  Meade Maw, M.D.    DATE OF BIRTH:  09/23/1950   DATE OF CONSULTATION:  09/13/2005  DATE OF DISCHARGE:                                   CONSULTATION   INDICATION FOR CONSULT:  Chest pain.   HISTORY:  Alicia [name] is a very pleasant 60 year old female, who I initially  evaluated in 2001 for chest pain.  She underwent an adenosine Cardiolite at  this time.  She was noted to have normal wall motion with an ejection  fraction of 63%.  There was no evidence of ischemia.  Alicia [name] notes that  since 2001, she has continued to have chest pain, which radiates to her  shoulder and her neck.  This pain will occur with rest and with exertion  while at times, persist throughout the entire day.  Thursday when she awoke  with the pain, it was more severe than usual and she presented to the  emergency room for further evaluation.  She did have nausea and vomiting,  and diarrhea with 1 day prior to this presentation.  She had associated  shortness of breath and light-headedness with the chest pain.  She  reportedly has had fevers and chills.  Chest pain sometimes was made worse  with deep inspiration and change in position.  She was given sublingual  nitroglycerin in the emergency room with questionable improvement of the  pain from an 8 to a 5.  She has had no further chest pain in the past 18  hours.  She has been up ambulating in her room.  She is noted to have  nonspecific S-T changes on her ECG.  Her serial cardiac enzymes are  negative.   PAST MEDICAL HISTORY:  1.  Significant for sleep apnea.  The patient is partially compliant with      her therapy.  She states she currently is having trouble with her      equipment.  2.  Chronic back pain.  3.  Diabetes.  4.  Schizophrenia.  5.  Hypertension.  6.  Cor pulmonale.  7.  Congestive heart failure thought to be secondary to diastolic      dysfunction.  8.  Insomnia.  9.  Ongoing tobacco abuse.  10. Dizziness attributed to hypersomnolence.   MEDICATIONS PRIOR TO ADMISSION:  1.  Diclofenac 100 mg SR q.a.m.  2.  Glucophage 500 mg daily.  3.  Hydrochlorothiazide 25 mg daily.  4.  Lorazepam 1 mg p.o. q.h.s.  5.  Lotensin 40 mg daily.  6.  Neurontin 400 mg p.o. q.h.s.  7.  Premarin 1.25 mg daily.  8.  Protonix 40 mg daily.  9.  Seroquel 600 mg q.a.m.  10. Wellbutrin 300 mg daily.   The patient continues with these medications with the exception of Seroquel  has not been ordered.  Of note, the patient's BiPAP has been ordered.  However, there has not been any set up for the patient.  PAST SURGICAL HISTORY:  Significant for a hysterectomy in 1991 secondary to  fibroids and carpal tunnel release.   FAMILY HISTORY:  Significant for father with coronary artery disease,  hypertension and diabetes mellitus, mother with hypertension and diabetes, 1  sister with MI and 1 sister with CVA.   SOCIAL HISTORY:  She has 4 adult children, lives with her daughter in  [place] and assists in the care of her grandchildren.  She has ongoing  tobacco use, she remains active with house work, she does vacuuming and is  able to clean her baths without exacerbation of the chest pain.   REVIEW OF SYSTEMS:  Is as noted per HPI.   PHYSICAL EXAMINATION:  VITAL SIGNS:  Blood pressure is 109/65, O2 sats 98%,  heart rate is 83 and she is afebrile.  GENERAL:  A very pleasant morbidly obese African-American female.  HEENT:  Difficult to evaluate secondary to body habitus.  There is no  obvious neck vein distention.  There are no carotid bruits noted.  PULMONARY EXAM:  Breath sounds, which are equal and clear to auscultation.  No use of accessory muscles.  CARDIOVASCULAR EXAM:  A regular rate and rhythm.  There are no audible  murmurs.  The patient is  noted to have markedly pendulous breasts.  Again,  exam made difficult by body habitus.  ABDOMEN:  Soft and benign.  EXTREMITIES:  No peripheral edema.  NEUROLOGIC:  Nonfocal.  SKIN:  Warm and dry.   LABORATORY DATA:  Her white count is 7, hemoglobin of 12.7, hematocrit 38.7  and platelet count is 242.  Sodium was 136, potassium 3.8, chloride 99, BUN  is 12, creatinine 0.8 and calcium 9.4.  Serial cardiac enzymes are negative.  Triglycerides 116, HDL 37, LDL is 126 and TSH is 7.95.  Her BNP was less  than 30 and her hemoglobin A1c was markedly elevated at 8.  Her chest x-ray  reveals no evidence of acute or cardiopulmonary disease.  Telemetry reveals  normal sinus rhythm at 85 beats per minute.   IMPRESSION:  1.  Chest pain, atypical in nature for cardiac in that it has been ongoing      for several years, but has recently increased in progression and      severity.  She was noted to have nonspecific S-T changes on her ECG.      Serial cardiac enzymes have been negative and I will schedule for a      stress Cardiolite for further evaluation.  In the interim, the patient      will continue with Lovenox and I have added aspirin 81 mg daily to her      regimen.  I will add aspirin 325 mg daily to her regimen.  2.  Hypertension.  The patient's blood pressure is well controlled.  3.  Obstructive sleep apnea, requiring BiPAP.  I have rewritten the orders      for initiation of her BiPAP.  4.  Tobacco abuse.  I agree with the smoking cessation consult.      Meade Maw, M.D.  Electronically Signed    HP/MEDQ  D:  09/13/2005  T:  09/14/2005  Job:  [id number]

## 2010-12-07 NOTE — Cardiovascular Report (Signed)
[name], SIGNORE NO.:  [id number]   MEDICAL RECORD NO.:  [id number]          PATIENT TYPE:  INP   LOCATION:  3705                         FACILITY:  [facility]   PHYSICIAN:  Meade Maw, M.D.    DATE OF BIRTH:  03-09-1951   DATE OF PROCEDURE:  09/16/2005  DATE OF DISCHARGE:                              CARDIAC CATHETERIZATION   REFERRING PHYSICIANS:  Pearlean Brownie, M.D.   INDICATIONS FOR PROCEDURE:  Questionable ischemia in the anteroapical  region. Study was suboptimal secondary to the patient's body habitus.   PROCEDURE:  After obtaining written informed consent, the patient was  brought to the cardiac catheterization lab in a postabsorptive state.  Preoperative sedation was achieved using Versed 2 milligrams IV. The right  groin was prepped and draped in the usual sterile fashion. Local anesthesia  was achieved using 1% Xylocaine. A 6-French hemostasis sheath was placed  into the right femoral artery. This was assisted by the use of a Smart  needle. Selective coronary angiography was performed using JL-4, JR-4  Judkins catheter. Multiple views were obtained. All catheter exchanges were  made over a guidewire. Single-plane ventriculogram was performed in the RAO  position using a 6-French pigtail curved catheter. Hemostasis sheath was  flushed following each catheter exchange. There was no immediate  complications. The patient was transferred to the holding area. Hemostasis  sheath was removed. Hemostasis was achieved using FemoStop device.   FINDINGS:  The aortic pressure was 128/87, LV pressure was noted to be149/0.  The EDP was 12. There was no significant gradient noted. There was reverse  gradient secondary to arrhythmias. Single-plane ventriculogram revealed  normal wall motion with ejection fraction of 65%. There was no significant  mitral regurgitation noted. Left main coronary artery bifurcates into the  left anterior descending and circumflex  vessel. There was no significant  disease noted in the left main coronary artery. Left anterior descending:  Left anterior descending is a narrow caliber vessel. It gives rise to a  trivial first diagonal and goes on to end as an apical branch. There is no  disease noted in the left anterior descending or its branches. Circumflex  vessel: Circumflex vessel is a large vessel that is codominant for the  posterior circulation. There is no disease noted in circumflex or its  branches. Right coronary artery: Right coronary artery is codominant for the  posterior circulation. It gives rise to trivial RV marginal, small to  moderate PDA and a small to moderate PL branch. There was no disease noted  in the right coronary artery or its branches.   FINAL IMPRESSION:  1.  Normal single-plane ventriculogram.  2.  Normal coronary angiography.  3.  False positive Cardiolite secondary to breast attenuation.   RECOMMENDATION:  To consider other etiologies for her chest pain.     Meade Maw, M.D.  Electronically Signed    HP/MEDQ  D:  09/16/2005  T:  09/17/2005  Job:  [id number]

## 2010-12-07 NOTE — Discharge Summary (Signed)
Alicia [name], Alicia [name]           ACCOUNT NO.:  [id number]   MEDICAL RECORD NO.:  [id number]          PATIENT TYPE:  INP   LOCATION:  3705                         FACILITY:  [facility]   PHYSICIAN:  Leighton Roach McDiarmid, M.D.DATE OF BIRTH:  May 27, 1951   DATE OF ADMISSION:  09/12/2005  DATE OF DISCHARGE:  09/17/2005                                 DISCHARGE SUMMARY   CONSULTATIONS THIS ADMISSION:  Cardiology, Meade Maw, M.D.   DISCHARGE DIAGNOSES:  1.  Chest pain.  2.  Type 2 diabetes.  3.  Hypertension.  4.  Schizophrenia.  5.  Hyperlipidemia.  6.  Obstructive sleep apnea.   DISCHARGE MEDICATIONS:  1.  Zocor 20 mg p.o. q.h.s.  2.  Glucophage 500 mg p.o. daily.  3.  Lorazepam 1 mg p.o. q.h.s.  4.  Lotensin 40 mg p.o. daily.  5.  Neurontin 400 mg p.o. daily.  6.  Premarin 1.25 mg p.o. daily.  7.  Lasix 100 mg p.o. q.h.s.  8.  Wellbutrin 300 mg p.o. daily.   CBC:  White blood count 8.2, hemoglobin 13.9, hematocrit 41.0, platelets  269.  Venous blood gas had a pH of 7.4, PCO2 of 50.1, bicarb 31.8.  Sodium  was 136, potassium 3.4, chloride 101.  Markers negative x3.  LFTs within  normal limits.  BNP less than 30.0.   BRIEF HISTORY OF PRESENT ILLNESS:  Alicia [name] is a 59 year old African-  American female who presented with an episode of left substernal chest  pain/pressure, this being at rest.  The pain was relieved after  nitroglycerin.  She did not have any associated symptoms of vomiting or  shortness of breath exam.  Her heart had a regular rate and rhythm without  murmurs.  Her lungs were clear bilaterally with no chest wall tenderness.  She did not have any peripheral edema.   HOSPITAL PROCEDURES:  1.  Cardiolite stress test:  Anterior apical ischemia detected on the stress      imaging with normal wall motion and an EF of 58%.  As this was an      abnormal test, a cardiac catheterization was recommended.  2.  Cardiac catheterization:  She had normal coronary  arteries.   HOSPITAL COURSE:  Problem 1.  The patient had a false positive Cardiolite stress test and a  normal catheterization.  Therefore, we do not need to discharge her on any  Zocor and the patient basically remained chest pain-free from admission  throughout the rest of the hospitalization.   Problem 2.  TYPE 2 DIABETES:  We initially had to hold her metformin and  manage her with sliding scale insulin while she was waiting and having these  tests done.  Her blood sugars remained stable and she was continued on her  current dose at discharge.   Problem 3.  HYPERTENSION:  Blood pressures remained well-controlled on her  home regimen.   Problem 4.  SCHIZOPHRENIA:  This remained stable on lorazepam and Seroquel  and Wellbutrin.   DISCHARGE LABORATORY DATA:  Lipid profile:  Cholesterol 186, triglycerides  115, HDL 37, LDL 126.  Hemoglobin A1c 8.0.  TSH 0.795.  Sodium 136,  potassium 3.6, chloride 99, bicarb 28, BUN 12, creatinine 0.8.   Dictation ended at this point.      Altamese Cabal, M.D.    ______________________________  Leighton Roach McDiarmid, M.D.    KS/MEDQ  D:  09/19/2005  T:  09/20/2005  Job:  [id number]   cc:   Ursula Beath, MD  Fax: [phone]   Meade Maw, M.D.  Fax: [phone]

## 2010-12-07 NOTE — Op Note (Signed)
[place]  Patient:    Alicia [name], Alicia [name] Visit Number: [id number] MRN: [id number]          Service Type: DSU Location: [facility] 2867 [id number] Attending Physician:  Karenann Cai Dictated by:   Marya Landry Carlyle Lipa., M.D. Admit Date:  09/07/2001 Discharge Date: 09/07/2001                             Operative Report  PREOPERATIVE DIAGNOSIS:  Immature cataract, right eye.  POSTOPERATIVE DIAGNOSIS:  Immature cataract, right eye.  OPERATION:  Kelman phacoemulsification cataract, right eye.  SURGEON:  Marya Landry. Carlyle Lipa., M.D.  ANESTHESIA:  Local using Xylocaine 2% with Marcaine 0.75% and Wydase.  JUSTIFICATION FOR PROCEDURE:  This is a 60 year old lady who complains of inability to see from the right eye.  She was evaluated and found to have a visual acuity best corrected to 20/400 on the right, 20/60 on the left. There were bilateral posterior subcapsular cataracts, worse on the right than the left.  Cataract extraction with intraocular lens implantation was recommended.  She is admitted at this time for that purpose.  PROCEDURE:  Under the influence of IV sedation and Van Lint akinesia, retrobulbar anesthesia was given.  The patient was prepped and draped in the usual manner.  The lid speculum was inserted under the upper and lower lids of the right eye, and a 4-0 silk traction suture was passed through the belly of the superior rectus muscle for traction.  A fornix-based conjunctival flap was turned, and hemostasis was achieved by using the cautery.  An incision was made in the sclera approximately 1 mm posterior to the limbus.  This incision was dissected down into clear cornea using a crescent blade.  A sideport incision was made at the 1:30 position.  Occucoat was injected into the eye through this sideport incision.  The anterior chamber was then entered through the corneoscleral tunnel incision at the 11:30 position.   An anterior capsulotomy was done using a bent 25-gauge needle.  The nucleus was hydrodissected using Xylocaine.  The KPE handpiece was passed into the eye, and the nucleus was emulsified without difficulty.  The residual cortical material was aspirated.  The posterior capsule was polished using the olive-tip polisher.  A foldable silicone lens was seated into the eye behind the iris without difficulty.  The anterior chamber was reformed, and the pupil was constricted using Miochol.  The residual Occucoat was aspirated from the eye.  The lips of the wound were hydrated.  It was found that there was a small wound leak; therefore, the corneoscleral wound was closed by using a single horizontal suture of 10-0 nylon.  After it was ascertained that the wound was airtight and watertight, the conjunctiva was closed using thermal cautery.  Celestone 1 cc and 0.5 cc of gentamicin were injected subconjunctivally.  Maxitrol ophthalmic ointment and Pilopine ointment were applied along with a patch and Fox shield.  The patient tolerated the procedure well and was discharged to the postanesthesia recovery room in satisfactory condition.  She is instructed to rest today, to take Vicodin every four hours as needed for pain, and to see me in the office tomorrow for further evaluation.  DISCHARGE DIAGNOSIS:  Immature cataract, right eye. Dictated by:   Marya Landry Carlyle Lipa., M.D. Attending Physician:  Karenann Cai DD:  09/07/01 TD:  09/07/01 Job: [id number]

## 2010-12-18 ENCOUNTER — Ambulatory Visit (INDEPENDENT_AMBULATORY_CARE_PROVIDER_SITE_OTHER): Payer: Self-pay | Admitting: Family Medicine

## 2010-12-18 ENCOUNTER — Encounter: Payer: Self-pay | Admitting: Family Medicine

## 2010-12-18 VITALS — BP 132/84 | HR 90 | Temp 98.5°F | Wt 205.0 lb

## 2010-12-18 DIAGNOSIS — S46819A Strain of other muscles, fascia and tendons at shoulder and upper arm level, unspecified arm, initial encounter: Secondary | ICD-10-CM

## 2010-12-18 DIAGNOSIS — S43499A Other sprain of unspecified shoulder joint, initial encounter: Secondary | ICD-10-CM

## 2010-12-18 MED ORDER — CYCLOBENZAPRINE HCL 10 MG PO TABS: 10.0000 mg | ORAL_TABLET | Freq: Every evening | ORAL | Status: DC | PRN

## 2010-12-18 MED ORDER — IBUPROFEN 600 MG PO TABS: 600.0000 mg | ORAL_TABLET | Freq: Four times a day (QID) | ORAL | Status: DC | PRN
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
## 2010-12-18 NOTE — Patient Instructions (Signed)
Follow up with Dr. Lelon Perla as needed. I hope you feel better!  - Dr. Wallene Huh
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
## 2011-01-16 ENCOUNTER — Encounter: Payer: Self-pay | Admitting: Family Medicine

## 2011-01-16 ENCOUNTER — Ambulatory Visit (INDEPENDENT_AMBULATORY_CARE_PROVIDER_SITE_OTHER): Payer: Medicare Other | Admitting: Family Medicine

## 2011-01-16 VITALS — BP 150/85 | HR 87 | Temp 98.2°F | Ht 64.5 in | Wt 235.6 lb

## 2011-01-16 DIAGNOSIS — E119 Type 2 diabetes mellitus without complications: Secondary | ICD-10-CM

## 2011-01-16 DIAGNOSIS — I10 Essential (primary) hypertension: Secondary | ICD-10-CM

## 2011-01-16 LAB — POCT GLYCOSYLATED HEMOGLOBIN (HGB A1C): Hemoglobin A1C: 6.1

## 2011-01-16 NOTE — Progress Notes (Signed)
  Subjective:    Patient ID: Alicia [name], female    DOB: 1950/09/02, 60 y.o.   MRN: [id number]  HPI 1. DMII:  She is taking her medications as prescribed.  She has been checking her blood sugar regularly.  It has been between 120-150.  She has been trying to watch her diet.  Weight is stable  2. HTN:  She is taking her medications as prescribed.  She doesn't check her blood pressure at home regularly.  She has been stressed out today because her daughter is making her late for her appointments   Review of Systems Denies chest pain, shortness of breath    Objective:   Physical Exam  Constitutional: She appears well-nourished. No distress.       obese  Eyes:       Normal fundoscopic  Neck: Normal range of motion. Neck supple.  Cardiovascular: Normal rate, regular rhythm and normal heart sounds.   Pulmonary/Chest: Effort normal and breath sounds normal. No respiratory distress. She has no wheezes.  Abdominal: Soft.  Musculoskeletal: She exhibits no edema.  Psychiatric: She has a normal mood and affect.          Assessment & Plan:

## 2011-01-16 NOTE — Assessment & Plan Note (Signed)
Appears to be under good control.  A1C at goal.  Will not make any changes today.

## 2011-01-16 NOTE — Assessment & Plan Note (Addendum)
Pts blood pressure not at goal.  It has been under better control at prior office visits.  She is not interested in making any changes to her medications today.  Reviewed the risks of elevated blood pressure.  Will continue to monitor.

## 2011-01-16 NOTE — Patient Instructions (Signed)
Thank you so much for your gifts.  That was very thoughtful and I appreciate it very much. Everything seems to be pretty stable with you and we will not make any changes today. My new practice [facility] [place].  The phone # is [phone].   Hopefully I'll see you in the future Take care and God bless

## 2011-01-25 ENCOUNTER — Telehealth: Payer: Self-pay | Admitting: Family Medicine

## 2011-01-25 NOTE — Telephone Encounter (Signed)
Pt will call and make an appointment at her convenience to be seen by me concerning foot orthotics before I can sent order to [facility].  Melodie Bouillon, MD

## 2011-02-07 ENCOUNTER — Ambulatory Visit (INDEPENDENT_AMBULATORY_CARE_PROVIDER_SITE_OTHER): Payer: Medicare Other | Admitting: Family Medicine

## 2011-02-07 ENCOUNTER — Encounter: Payer: Self-pay | Admitting: Family Medicine

## 2011-02-07 DIAGNOSIS — M79673 Pain in unspecified foot: Secondary | ICD-10-CM | POA: Insufficient documentation

## 2011-02-07 DIAGNOSIS — I10 Essential (primary) hypertension: Secondary | ICD-10-CM

## 2011-02-07 DIAGNOSIS — M79609 Pain in unspecified limb: Secondary | ICD-10-CM

## 2011-02-07 DIAGNOSIS — F17200 Nicotine dependence, unspecified, uncomplicated: Secondary | ICD-10-CM

## 2011-02-07 NOTE — Assessment & Plan Note (Addendum)
BP elevated today but she does endorse stress. Lower on repeat check. Encouraged pt to continue taking her medications DAILY without skipping doses. If it remains elevated, we will discuss her current medication regimen at her next visit. Congratulated her on a 5lb weight loss and I hope she continues to lose weight.

## 2011-02-07 NOTE — Assessment & Plan Note (Signed)
Pt started smoking again one year ago, and she does express interest in quitting with patches. I told her I would be happy to help her in any way I can. I gave her the quit line number, if she needs more support. I hope she will be ready to take the next step when she meets with Dr. Raymondo Band in pharmacy clinic.

## 2011-02-07 NOTE — Assessment & Plan Note (Signed)
New onset, 4-5 months ago. Pt thinks that ankle foot orthosis will help her pain with walking and I tend to agree, especially if she feels this will help her stay active. I have completed an order for [facility] for her foot pain and ankle instability. We discussed what type of shoes she should be wearing.  Given the patient's PMH of DM, HTN, CHF and smoking, I am concerned that this pain may be from PAD. She does endorse cramping, reproducible type of pain with walking. Will refer to pharmacy clinic to have an ABI with Dr. Raymondo Band. Pt did state she does not have reliable transportation, so I will follow-up with her next week to make sure she has made an appointment for pharmacy clinic.

## 2011-02-07 NOTE — Patient Instructions (Signed)
It was so nice to see you again today! I enjoyed talking with you!  For your foot pain, I am going to send in your foot orthosis form for you. I hope that will help! I will send in a referral for Dr. Macky Lower clinic for an ABI, which will test the blood flow in your legs.  You can pick up any multi-vitamin at the drug store.   Let me know what I can help you do to help you quit smoking. I believe you can quit, and I know it will make you feel better! There is a help line 1-800-QUITNOW that can offer support, if you need it!   If you need any refills before you come back to see me, please have your pharmacy let me know.  Please call and make an appointment for the end of September or early October. I look forward to seeing you then!  Lutisha Knoche M. Karion Cudd, M.D.

## 2011-02-07 NOTE — Progress Notes (Signed)
  Subjective:    Patient ID: Alicia [name], female    DOB: 1950/12/22, 60 y.o.   MRN: [id number]  HPI 60 yo female with PMH of DM, HTN, CHF, smoking, and obesity presenting today for bilateral foot pain. She had asked the orthotic company for a brace for her ankles because she felt this would help the pain. The company then sent me a request for a Physician order, which was not able to complete until I saw her for this pain.   1. Bilateral Foot Pain- Pt states this pain started approx 4-5 months ago. She has never had anything like this before. She describes it as a "cramping pain." She said the best way to explain the pain was like she was walking in high heels. The pain is worse with walking, better with rest or massage. She says there is not a certain distance that provokes the pain, but it does hurt after she has been walking a while. She states her calves do not hurt but she endorses Achilles pain that feels "crampy." She said she likes to be active and is on her feet a lot. It concerns her because she feels like her ankles are unstable and weak. She usually wears flat sandals or bedroom slippers.   2. HTN- Blood pressure elevated at triage today to 165/96 but on my recheck in the room it was 148/88. She said she has missed some doses of her medication but she thinks it is high today because she has had her grandsons at her house.  3. Smoking- Pt states she is smoking again. She quit for a number of years and started smoking again one year ago. She states she has only had one cigarette today and that was early this morning. She does express interest in quitting, and mentioned using her nicotine patches again.    Review of Systems  Constitutional: Negative for activity change and fatigue.  Respiratory: Negative for shortness of breath.   Cardiovascular: Negative for leg swelling.  Gastrointestinal: Negative for nausea, vomiting and diarrhea.  Musculoskeletal: Positive for myalgias, joint  swelling and arthralgias. Negative for gait problem.  Skin: Negative for rash.       Objective:   Physical Exam  Nursing note and vitals reviewed. Constitutional: She is oriented to person, place, and time. She appears well-developed and well-nourished. No distress.  HENT:  Head: Normocephalic and atraumatic.  Cardiovascular: Normal rate, regular rhythm and normal heart sounds.   Pulmonary/Chest: Effort normal and breath sounds normal. She has no wheezes.  Abdominal: There is no CVA tenderness.  Musculoskeletal: She exhibits no edema.       Right ankle: Normal. She exhibits normal range of motion, no swelling, no deformity and normal pulse. Achilles tendon normal.       Left ankle: Normal. She exhibits normal range of motion, no swelling, no deformity and normal pulse. Achilles tendon normal.       No tenderness, redness or swelling of feet today. She has full ROM. Pulses intact bilaterally, skin warm. Some dry skin on feet but no lesions noted.   Neurological: She is alert and oriented to person, place, and time.  Skin: Skin is warm and dry.  Psychiatric: She has a normal mood and affect.          Assessment & Plan:

## 2011-03-05 ENCOUNTER — Encounter: Payer: Self-pay | Admitting: Pharmacist

## 2011-03-05 ENCOUNTER — Ambulatory Visit (INDEPENDENT_AMBULATORY_CARE_PROVIDER_SITE_OTHER): Payer: Medicare Other | Admitting: Pharmacist

## 2011-03-05 DIAGNOSIS — I10 Essential (primary) hypertension: Secondary | ICD-10-CM

## 2011-03-05 DIAGNOSIS — M79609 Pain in unspecified limb: Secondary | ICD-10-CM

## 2011-03-05 DIAGNOSIS — M79673 Pain in unspecified foot: Secondary | ICD-10-CM

## 2011-03-05 DIAGNOSIS — F17200 Nicotine dependence, unspecified, uncomplicated: Secondary | ICD-10-CM

## 2011-03-05 NOTE — Assessment & Plan Note (Signed)
Mild Nicotine Dependence of <1 years duration in a patient who is excellent candidate for success b/c of previous quit for 4 years.  She has 7mg  nicotine patches which she agreed to start on Fri (8/17).  Written information provided.   F/U Rx Clinic Visit with Dr. Raymondo Band in 2-3 weeks.  Total time with patient in face-to-face counseling 45 minutes.  Patient seen with Audry Riles, PharmD Candidate and Albertine Grates, Pharmacy Resident .

## 2011-03-05 NOTE — Assessment & Plan Note (Signed)
Normal ABI and low likelihood of PAD based on ABI of 0.99 in a patient with symptoms of ankle/calf cramping.  Results reviewed and written information provided.  F/U Rx Clinic Visit with Dr. Raymondo Band in 2-3 weeks.  Total time with patient in face-to-face counseling 45 minutes.  Patient seen with Audry Riles, PharmD Candidate and Albertine Grates, Pharmacy Resident .

## 2011-03-05 NOTE — Progress Notes (Signed)
  Subjective:    Patient ID: Alicia [name], female    DOB: 09-Feb-1951, 60 y.o.   MRN: [id number]  HPI Reviewed and agree with Dr. Macky Lower management.   Review of Systems     Objective:   Physical Exam        Assessment & Plan:

## 2011-03-05 NOTE — Patient Instructions (Addendum)
It was nice to meet you today! Your Ankle-Brachial Index (ABI) test was normal. You have a good understanding of important health changes needed in your life.  My Health Plan:  1. Start using Nicotine patch (7mg ) on Friday, Aug 17 2. Take blood pressure medications DAILY! (Amlodipine, Benazepril, Hydrochlorothiazide) 3. Return to the gym like before.  Keep it up!  Return to clinic for visit with Dr. Raymondo Band in 2-3 weeks.

## 2011-03-05 NOTE — Assessment & Plan Note (Signed)
History of Stage 1 HTN non-adherent to triple therapy with amlodipine, benazepril, and HCTZ.  Pt not at goal BP of <130/80.  Reason for nonadherence is unclear, but may be related to lack of motivation.  Discussed with her importance/benefits of adherence, and she expressed understanding. F/U Rx Clinic Visit with Dr. Raymondo Band in 2-3 weeks.  Total time with patient in face-to-face counseling 45 minutes.  Patient seen with Audry Riles, PharmD Candidate and Albertine Grates, Pharmacy Resident .

## 2011-03-05 NOTE — Progress Notes (Signed)
  Subjective:    Patient ID: Alicia [name], female    DOB: [birth year], 60 y.o.   MRN: [id number]  HPI Alicia [name] is a pleasant lady who opened up after spending some time with [place].  She reports pain with walking.  Pain is described as cramping in ankle and calf which occurs after variable duration of exercise.  Reports pain starting while at rest and in the middle of the night.  She also reports smoking again (up to a 1/2 ppd) after having quit for 4 years, which she attributes to family stress.  She expressed a desire to quit again and to return to the gym where she used to exercise for 2.5 hours 5 days/week.  She reports not being adherent to her medications (especailly for BP) without any concrete reason, and states that most day she doesn't "feel like it."  Review of Systems As per HPI    Objective:   Physical Exam Lower extremity Physical Exam includes normal feel and appearance bilaterally.  ABI overall = 0.99 Right Arm 182 mmHg    Left Arm 172 mmHg Right ankle posterior tibial 188 mmHg     dorsalis pedis 164 mmHg Left ankle posterior tibial 164 mmHg    dorsalis pedis 180 mmHg     Assessment & Plan:  Normal ABI and low likelihood of PAD based on ABI of 0.99 in a patient with symptoms of ankle/calf cramping.  Results reviewed and written information provided.   Mild Nicotine Dependence of <1 years duration in a patient who is excellent candidate for success b/c of previous quit for 4 years.  She has 7mg  nicotine patches which she agreed to start on Fri (8/17).  Written information provided.    History of Stage 1 HTN non-adherent to triple therapy with amlodipine, benazepril, and HCTZ.  Pt not at goal BP of <130/80.  Reason for nonadherence is unclear, but may be related to lack of motivation.  Discussed with her importance/benefits of adherence, and she expressed understanding. F/U Rx Clinic Visit with Dr. Raymondo Band in 2-3 weeks.  Total time with patient in face-to-face counseling  45 minutes.  Patient seen with Alicia [name], PharmD Candidate and Alicia [name], Pharmacy Resident .

## 2011-03-14 ENCOUNTER — Other Ambulatory Visit: Payer: Self-pay | Admitting: Family Medicine

## 2011-03-14 DIAGNOSIS — I10 Essential (primary) hypertension: Secondary | ICD-10-CM

## 2011-03-14 NOTE — Telephone Encounter (Signed)
Refilled all meds except simvastatin. It is not on current med list. Will send message to Dr. Mikel Cella to please advise. Dr. Raymondo Band noted in visit from 08/14'2012 that patient has not taken for past 30 days .

## 2011-03-29 ENCOUNTER — Ambulatory Visit: Payer: No Typology Code available for payment source | Admitting: Pharmacist

## 2011-05-08 ENCOUNTER — Ambulatory Visit (INDEPENDENT_AMBULATORY_CARE_PROVIDER_SITE_OTHER): Payer: No Typology Code available for payment source | Admitting: *Deleted

## 2011-05-08 DIAGNOSIS — Z23 Encounter for immunization: Secondary | ICD-10-CM

## 2011-05-31 ENCOUNTER — Telehealth: Payer: Self-pay | Admitting: Family Medicine

## 2011-05-31 DIAGNOSIS — I10 Essential (primary) hypertension: Secondary | ICD-10-CM

## 2011-05-31 MED ORDER — HYDROCHLOROTHIAZIDE 25 MG PO TABS: 25.0000 mg | ORAL_TABLET | Freq: Every day | ORAL | Status: DC

## 2011-05-31 MED ORDER — METFORMIN HCL 1000 MG PO TABS: 1000.0000 mg | ORAL_TABLET | Freq: Two times a day (BID) | ORAL | Status: DC

## 2011-05-31 MED ORDER — AMLODIPINE BESYLATE 10 MG PO TABS: 10.0000 mg | ORAL_TABLET | Freq: Every day | ORAL | Status: DC

## 2011-05-31 MED ORDER — BENAZEPRIL HCL 40 MG PO TABS: 40.0000 mg | ORAL_TABLET | Freq: Every day | ORAL | Status: DC

## 2011-05-31 MED ORDER — SIMVASTATIN 40 MG PO TABS: 40.0000 mg | ORAL_TABLET | Freq: Every day | ORAL | Status: DC

## 2011-05-31 NOTE — Telephone Encounter (Signed)
All 5 Rx were sent electronically to [facility] on [address]. Please make patient aware. Thank you!

## 2011-05-31 NOTE — Telephone Encounter (Signed)
Ms. Co is calling back because the Pharmacy has not gotten the Prescription refills yet.  She was told when she called this morning that we require 24 - 48 hours.

## 2011-05-31 NOTE — Telephone Encounter (Signed)
Pt checking to be sure we received the rx request for 5 different meds yesterday from [facility]/ [address]

## 2011-07-03 ENCOUNTER — Other Ambulatory Visit: Payer: Self-pay | Admitting: Family Medicine

## 2011-07-03 DIAGNOSIS — I10 Essential (primary) hypertension: Secondary | ICD-10-CM

## 2011-09-25 ENCOUNTER — Other Ambulatory Visit: Payer: Self-pay | Admitting: Family Medicine

## 2011-09-25 ENCOUNTER — Encounter: Payer: Self-pay | Admitting: *Deleted

## 2011-09-25 DIAGNOSIS — I10 Essential (primary) hypertension: Secondary | ICD-10-CM

## 2011-09-25 NOTE — Telephone Encounter (Signed)
This encounter was created in error - please disregard.

## 2011-09-27 ENCOUNTER — Telehealth: Payer: Self-pay | Admitting: Family Medicine

## 2011-09-27 NOTE — Telephone Encounter (Signed)
Pt was unaware of what she needed.  Called pharmacy and they state that she has at least one refil on all meds.  Called pt back and informed her, also advised to keep appt on 4.4.13, pt agreeable. Bobbi Kozakiewicz, Maryjo Rochester

## 2011-09-27 NOTE — Telephone Encounter (Signed)
Patient is calling for refills on several meds that she doesn't remember which ones.  She said that her pharmacy, [facility] on [address], had been contacted several days ago, but there is no refill request in Dr. Alfonzo Beers box.

## 2011-10-24 ENCOUNTER — Ambulatory Visit (INDEPENDENT_AMBULATORY_CARE_PROVIDER_SITE_OTHER): Payer: Medicare Other | Admitting: Family Medicine

## 2011-10-24 ENCOUNTER — Encounter: Payer: Self-pay | Admitting: Family Medicine

## 2011-10-24 VITALS — BP 156/88 | HR 87 | Temp 98.5°F | Ht 64.0 in | Wt 237.0 lb

## 2011-10-24 DIAGNOSIS — E119 Type 2 diabetes mellitus without complications: Secondary | ICD-10-CM

## 2011-10-24 DIAGNOSIS — K219 Gastro-esophageal reflux disease without esophagitis: Secondary | ICD-10-CM

## 2011-10-24 DIAGNOSIS — I10 Essential (primary) hypertension: Secondary | ICD-10-CM

## 2011-10-24 LAB — POCT GLYCOSYLATED HEMOGLOBIN (HGB A1C): Hemoglobin A1C: 6.3

## 2011-10-24 MED ORDER — OMEPRAZOLE 20 MG PO CPDR: 20.0000 mg | DELAYED_RELEASE_CAPSULE | Freq: Every day | ORAL | Status: DC

## 2011-10-24 MED ORDER — METFORMIN HCL 1000 MG PO TABS: 1000.0000 mg | ORAL_TABLET | Freq: Two times a day (BID) | ORAL | Status: DC

## 2011-10-24 MED ORDER — BENAZEPRIL HCL 40 MG PO TABS: 40.0000 mg | ORAL_TABLET | Freq: Every day | ORAL | Status: DC

## 2011-10-24 MED ORDER — AMLODIPINE BESYLATE 10 MG PO TABS: 10.0000 mg | ORAL_TABLET | Freq: Every day | ORAL | Status: DC

## 2011-10-24 MED ORDER — HYDROCHLOROTHIAZIDE 25 MG PO TABS: 25.0000 mg | ORAL_TABLET | Freq: Every day | ORAL | Status: DC

## 2011-10-24 MED ORDER — SIMVASTATIN 40 MG PO TABS: 40.0000 mg | ORAL_TABLET | Freq: Every day | ORAL | Status: DC

## 2011-10-24 NOTE — Assessment & Plan Note (Signed)
Although symptoms described are not exactly typical of reflux, I believe the patient has excessive gas in her epigastrium. We will begin with a PPI and that we'll hopefully help some of her symptoms. Encouraged her to use over-the-counter gas ex which may help as well. The patient begins experiencing any type of chest pain, vomiting or notices any blood she should let us know immediately.

## 2011-10-24 NOTE — Progress Notes (Signed)
Subjective:     Patient ID: Alicia [name], female   DOB: 11-12-1950, 61 y.o.   MRN: [id number]  HPI Patient is a 61 yo F presenting for follow up appointment for her chronic medical problems. She is also concerned about a "bloated" feeling she has after eating.  1. DM- Patient states she does not remember any lows in her blood sugar recently. She says "every once in a blue moon" she has a weak feeling. She has not checked her blood sugar within the last week due to being busy but before that her blood sugar was within range in the 100s. Patient denies any foot trauma or injury. She does not state that she checks her feet often. Patient states that she tries her best to avoid sugary foods, and excess carbs. I congratulated her on this. Patient's last eye exam was last year she states she will schedule another appointment. Her last lipid panel was in 2011, she is due for a lipid panel now.  2. HTN- blood pressures elevated again today. Patient states she has a lot of stress, right now with her son moving back home and her grandkids causing problems. She is very hesitant to change any of her medications today. We discussed that her blood pressure was high at her last visit as well as today and that I would feel more comfortable if she did make some changes. She states she will continue to take her medications and will return to the office in 3 months but she does not want to make changes today. She denies any headaches, changes in her vision, chest pain, peripheral edema. Patient did begin smoking again with the increased stress in her life. Encouraged her to stop smoking.  3. Epigastric fullness- patient describes every time she she has a bloated feeling in her epigastrium. She denies any true burning or chest pain she does state that it happens every time she. She does not have increased belching or gas. She states this is bothersome to her. She has not changed anything significantly in her diet. She  is avoiding irritating foods.   Review of Systems  Constitutional: Negative for fever and activity change.  HENT: Negative for congestion.   Eyes: Negative for visual disturbance.  Respiratory: Negative for cough and shortness of breath.   Cardiovascular: Negative for chest pain.  Gastrointestinal: Positive for abdominal pain and abdominal distention.  Genitourinary: Negative for difficulty urinating.  Musculoskeletal: Negative for myalgias and arthralgias.       Objective:   Physical Exam  Constitutional: She appears well-developed and well-nourished. No distress.  HENT:  Head: Normocephalic and atraumatic.  Cardiovascular: Normal rate and regular rhythm.   Pulmonary/Chest: Effort normal and breath sounds normal.  Abdominal: Soft. There is no tenderness.  Musculoskeletal: Normal range of motion. She exhibits no edema and no tenderness.  Skin: No rash noted.       Assessment:     61 yo F presenting for follow up of chronic medical problems    Plan:

## 2011-10-24 NOTE — Patient Instructions (Signed)
I called your prescriptions into [facility].  I added Prilosec to take every morning. You can also take over the counter Gas-X (Simethacon) daily as needed.  Your A1C was 6.3. Keep up the great work!  Your blood pressure was high again today. Come back to see me in 3 months. If it is still high then, we will do ambulatory blood pressure monitoring and consider changing your medications.  I have included info about the DASH diet.  Yarissa Reining M. Raylen Tangonan, M.D.   DASH Diet The DASH diet stands for "Dietary Approaches to Stop Hypertension." It is a healthy eating plan that has been shown to reduce high blood pressure (hypertension) in as little as 14 days, while also possibly providing other significant health benefits. These other health benefits include reducing the risk of breast cancer after menopause and reducing the risk of type 2 diabetes, heart disease, colon cancer, and stroke. Health benefits also include weight loss and slowing kidney failure in patients with chronic kidney disease.  DIET GUIDELINES  Limit salt (sodium). Your diet should contain less than 1500 mg of sodium daily.   Limit refined or processed carbohydrates. Your diet should include mostly whole grains. Desserts and added sugars should be used sparingly.   Include small amounts of heart-healthy fats. These types of fats include nuts, oils, and tub margarine. Limit saturated and trans fats. These fats have been shown to be harmful in the body.  CHOOSING FOODS  The following food groups are based on a 2000 calorie diet. See your Registered Dietitian for individual calorie needs. Grains and Grain Products (6 to 8 servings daily)  Eat More Often: Whole-wheat bread, brown rice, whole-grain or wheat pasta, quinoa, popcorn without added fat or salt (air popped).   Eat Less Often: White bread, white pasta, white rice, cornbread.  Vegetables (4 to 5 servings daily)  Eat More Often: Fresh, frozen, and canned vegetables.  Vegetables may be raw, steamed, roasted, or grilled with a minimal amount of fat.   Eat Less Often/Avoid: Creamed or fried vegetables. Vegetables in a cheese sauce.  Fruit (4 to 5 servings daily)  Eat More Often: All fresh, canned (in natural juice), or frozen fruits. Dried fruits without added sugar. One hundred percent fruit juice ( cup [237 mL] daily).   Eat Less Often: Dried fruits with added sugar. Canned fruit in light or heavy syrup.  [employer], Fish, and Poultry (2 servings or less daily. One serving is 3 to 4 oz [85-114 g]).  Eat More Often: Ninety percent or leaner ground beef, tenderloin, sirloin. Round cuts of beef, chicken breast, [place] breast. All fish. Grill, bake, or broil your meat. Nothing should be fried.   Eat Less Often/Avoid: Fatty cuts of meat, [place], or chicken leg, thigh, or wing. Fried cuts of meat or fish.  Dairy (2 to 3 servings)  Eat More Often: Low-fat or fat-free milk, low-fat plain or light yogurt, reduced-fat or part-skim cheese.   Eat Less Often/Avoid: Milk (whole, 2%, skim, or chocolate).Whole milk yogurt. Full-fat cheeses.  Nuts, Seeds, and Legumes (4 to 5 servings per week)  Eat More Often: All without added salt.   Eat Less Often/Avoid: Salted nuts and seeds, canned beans with added salt.  Fats and Sweets (limited)  Eat More Often: Vegetable oils, tub margarines without trans fats, sugar-free gelatin. Mayonnaise and salad dressings.   Eat Less Often/Avoid: Coconut oils, palm oils, butter, stick margarine, cream, half and half, cookies, candy, pie.  FOR MORE INFORMATION [employer]  Diet Eating Plan: www.dashdiet.org Document Released: 06/27/2011 Document Reviewed: 06/17/2011 [facility] Patient Information 2012 [place], [place].

## 2011-10-24 NOTE — Assessment & Plan Note (Signed)
Patient with elevated blood pressures today to 156/88. She is currently on amlodipine and hydrochlorothiazide for her hypertension. She states she is stressed today and does not want to make any changes. Agreed with patient she can followup in 3 months. If her blood pressure still elevated at that time we will refer for ambulatory blood pressure monitoring. Based on the readings of the ambulatory monitoring, we will make changes in her medication as needed. Patient agrees with this plan. If patient has any chest pain, headaches or changes in her vision she should be evaluated sooner.

## 2011-10-24 NOTE — Assessment & Plan Note (Signed)
Patient's diabetes relatively well controlled. Her hemoglobin A1c is 6.3 today which is stable from her last check. She is due for a lipid panel, which she will return in the morning when fasting to obtain. Encouraged her to continue to keep track of her blood sugars at home she will need blood work soon. She will schedule appointment with her eye doctor. Again encouraged patient to stop smoking and begin following a DASH diet.

## 2011-11-01 ENCOUNTER — Other Ambulatory Visit: Payer: Self-pay | Admitting: Family Medicine

## 2011-11-08 ENCOUNTER — Other Ambulatory Visit: Payer: No Typology Code available for payment source

## 2011-11-08 ENCOUNTER — Other Ambulatory Visit: Payer: Medicare Other

## 2011-11-08 DIAGNOSIS — E119 Type 2 diabetes mellitus without complications: Secondary | ICD-10-CM

## 2011-11-08 NOTE — Progress Notes (Signed)
FLP DONE TODAY Alicia [name] 

## 2011-11-09 LAB — LIPID PANEL
LDL Cholesterol: 135 mg/dL — ABNORMAL HIGH (ref 0–99)
Triglycerides: 134 mg/dL (ref <150)
VLDL: 27 mg/dL (ref 0–40)

## 2011-11-12 ENCOUNTER — Telehealth: Payer: Self-pay | Admitting: Family Medicine

## 2011-11-12 NOTE — Telephone Encounter (Signed)
Abnormal results, will forward to PCP. 

## 2011-11-12 NOTE — Telephone Encounter (Signed)
Is asking for results of her test from Friday

## 2011-11-13 ENCOUNTER — Encounter: Payer: Self-pay | Admitting: Family Medicine

## 2011-11-13 ENCOUNTER — Other Ambulatory Visit: Payer: Self-pay | Admitting: Family Medicine

## 2011-11-13 MED ORDER — SIMVASTATIN 40 MG PO TABS: 40.0000 mg | ORAL_TABLET | Freq: Every day | ORAL | Status: DC

## 2011-11-13 NOTE — Progress Notes (Signed)
Spoke with patient about her LDL being high at 135. Will restart her Simvastatin 40mg  qhs. (Patient states she was not taking this medication recently, but is willing to restart it.)  Will send copy of her lab work in a letter.  Alicia [name], M.D.

## 2011-11-13 NOTE — Telephone Encounter (Signed)
Spoke with patient about lab results. Refill sent to [facility].   Thanks! Tekeya Geffert M. Yobani Schertzer, M.D.

## 2011-12-10 ENCOUNTER — Encounter: Payer: Self-pay | Admitting: Gastroenterology

## 2012-01-14 ENCOUNTER — Encounter: Payer: Self-pay | Admitting: Gastroenterology

## 2012-01-22 ENCOUNTER — Ambulatory Visit (INDEPENDENT_AMBULATORY_CARE_PROVIDER_SITE_OTHER): Payer: Medicare Other | Admitting: Family Medicine

## 2012-01-22 ENCOUNTER — Encounter: Payer: Self-pay | Admitting: Family Medicine

## 2012-01-22 VITALS — BP 170/100 | HR 89 | Temp 98.8°F | Ht 64.0 in

## 2012-01-22 DIAGNOSIS — H6690 Otitis media, unspecified, unspecified ear: Secondary | ICD-10-CM

## 2012-01-22 DIAGNOSIS — I10 Essential (primary) hypertension: Secondary | ICD-10-CM

## 2012-01-22 DIAGNOSIS — H6692 Otitis media, unspecified, left ear: Secondary | ICD-10-CM | POA: Insufficient documentation

## 2012-01-22 MED ORDER — AMOXICILLIN 500 MG PO CAPS: 500.0000 mg | ORAL_CAPSULE | Freq: Two times a day (BID) | ORAL | Status: AC

## 2012-01-22 MED ORDER — FLUTICASONE PROPIONATE 50 MCG/ACT NA SUSP: 2.0000 | Freq: Every day | NASAL | Status: DC

## 2012-01-22 NOTE — Patient Instructions (Addendum)
Thank you for coming in today, it was nice to meet you It appears that you have an infection in your left ear.  There is also some fluid behind your eardrum. I have sent in an antibiotic.  I have also sent in a nasal spray to help decrease inflammation and help the hear drain easier Please follow up with your primary care doctor in the next 2 weeks to recheck your ear.   Also be sure to take all your medications, especially your blood pressure medications daily as prescribed.

## 2012-01-26 NOTE — Assessment & Plan Note (Signed)
Exam consistent with OM of L ear.  I suspect she also has a degree of eustachian tube dysfunction, causing middle ear effusion and medium for infection.  Will treat with amoxicillin and flonase.

## 2012-01-26 NOTE — Assessment & Plan Note (Addendum)
BP elevated today, states she has not taken her medications in a few days.  Advised she should take all her medications daily as prescribed.  Told to f/u with pcp.

## 2012-01-26 NOTE — Progress Notes (Signed)
  Subjective:    Patient ID: Alicia [name], female    DOB: 1951/05/13, 61 y.o.   MRN: [id number]  HPI 1.  Ear problem:  Here with complaint of ear pain and fullness.  This has been going on for 5 days.  She states that she feels like she is "underwater".  She also states she has felt a throbbing pain inside her ear.  She does endorse some mild ringing in her ears.  She denies any decrease in hearing, fever, dizziness, cough or nasal congestion.  She has not tried anything for her pain.    2.  HTN:  BP elevated today.  She has not taken her medications in a few days because she states she has forgotten or has not had time.  She denies chest pain, nausea, headache, palpitations, vision changes.    Review of Systems Per HPI    Objective:   Physical Exam  Constitutional: She appears well-nourished. No distress.  HENT:  Head: Normocephalic and atraumatic.  Right Ear: External ear and ear canal normal.  Left Ear: External ear and ear canal normal.  Mouth/Throat: Oropharynx is clear and moist. No oropharyngeal exudate.       L ear with middle ear effusion.  TM is reddened and bulging.  R TM normal.    Eyes: Conjunctivae are normal. Pupils are equal, round, and reactive to light.  Neck: Neck supple.  Cardiovascular: Normal rate, regular rhythm and normal heart sounds.   Pulmonary/Chest: Effort normal and breath sounds normal. No respiratory distress. She has no wheezes.  Lymphadenopathy:    She has no cervical adenopathy.          Assessment & Plan:

## 2012-01-31 ENCOUNTER — Encounter: Payer: Self-pay | Admitting: Family Medicine

## 2012-01-31 ENCOUNTER — Ambulatory Visit (INDEPENDENT_AMBULATORY_CARE_PROVIDER_SITE_OTHER): Payer: Medicare Other | Admitting: Family Medicine

## 2012-01-31 VITALS — BP 153/90 | HR 73 | Temp 98.3°F | Ht 64.0 in

## 2012-01-31 DIAGNOSIS — E669 Obesity, unspecified: Secondary | ICD-10-CM

## 2012-01-31 DIAGNOSIS — I10 Essential (primary) hypertension: Secondary | ICD-10-CM

## 2012-01-31 DIAGNOSIS — H6690 Otitis media, unspecified, unspecified ear: Secondary | ICD-10-CM

## 2012-01-31 DIAGNOSIS — H6692 Otitis media, unspecified, left ear: Secondary | ICD-10-CM

## 2012-01-31 NOTE — Assessment & Plan Note (Signed)
Resolved. She continues to have fullness behind TM which will likely resolve over time but no signs of acute infection. Continue Flonase for now. If symptoms worsen or if she has any other concerns, she will return to clinic.

## 2012-01-31 NOTE — Patient Instructions (Signed)
It was good to see you today!  Your ear looks better! Just keep taking the Flonase.  Please restart your blood pressure medication.  Make an appointment on your way out to see Arlys John to discuss your weight.  Come back and see me in 3 months for a regular follow up, or sooner if needed.  Suezette Lafave M. Gustava Berland, M.D.

## 2012-01-31 NOTE — Assessment & Plan Note (Signed)
Weight has been increasing 30lbs in the last year. Psych meds could be contributing, but I would like to focus on her diet and exercise. She states she is doing the right things. She has been to see Dr. Gerilyn Pilgrim and is not interested in a return visit at this time. Will refer to Arlys John for coaching, mostly to hold Alicia [name] accountable and to push her more. Will follow up in 3 months with me.

## 2012-01-31 NOTE — Assessment & Plan Note (Signed)
BP elevated today. Encouraged patient to restart medications. Discussed negative health benefits of hypertension. Red flag symptoms also discussed.

## 2012-01-31 NOTE — Progress Notes (Signed)
Subjective:     Patient ID: Alicia [name], female   DOB: 1951-01-19, 61 y.o.   MRN: [id number]  HPI  Pt returning for follow-up of left otitis media  1. Otitis- Improved. Denies pain, pressure, fullness, decreased hearing, neck pain, fevers or discharge from ear. Overall she states she is better. She has one dose remaining of antibiotic. Is using Flonase daily.   2. HTN- History of HTN. Not currently taking antihypertensives because she didn't know how it would react with her other medications. She states she has increased stressed secondary to her father's death and the end of a long term relationship. No HA, CP or leg swelling. Encouraged patient to restart medications since her BP was 153/90.  3. Health Maintenance- Scheduled for colonoscopy next week. Plans on getting flu shot this fall  4. Weight- Patient concerned about weight gain. She had a while where she was losing weight. States nothing has changed but she is gaining weight. Eating the same (no fried foods, decreased starches, etc.) Continues to exercise although not as much. My concern is her antipsychotics prescribed by Mental Health may be contributing. She has seen nutrition in the past.   History reviewed: Smoker. Social history- has a new "friend" named Chanetta Marshall who lives in [place].   Review of Systems See HPI above    Objective:   Physical Exam  Constitutional: She appears well-developed and well-nourished. No distress.  HENT:  Head: Normocephalic and atraumatic.  Right Ear: Tympanic membrane, external ear and ear canal normal.  Left Ear: Hearing and external ear normal. No drainage, swelling or tenderness. Tympanic membrane is injected and bulging.  Nose: Nose normal.  Mouth/Throat: Oropharyngeal exudate present.  Cardiovascular: Normal rate, regular rhythm and normal heart sounds.   Pulmonary/Chest: Effort normal and breath sounds normal.  Abdominal: Soft. There is no tenderness.     Assessment:     61 yo F with  improved otitis media, HTN, obesity    Plan:

## 2012-02-11 ENCOUNTER — Ambulatory Visit: Payer: Medicare Other | Admitting: Home Health Services

## 2012-02-20 ENCOUNTER — Ambulatory Visit: Payer: No Typology Code available for payment source | Admitting: Gastroenterology

## 2012-03-25 ENCOUNTER — Ambulatory Visit (INDEPENDENT_AMBULATORY_CARE_PROVIDER_SITE_OTHER): Payer: Medicare Other | Admitting: Gastroenterology

## 2012-03-25 ENCOUNTER — Encounter: Payer: Self-pay | Admitting: Gastroenterology

## 2012-03-25 ENCOUNTER — Other Ambulatory Visit (INDEPENDENT_AMBULATORY_CARE_PROVIDER_SITE_OTHER): Payer: Medicare Other

## 2012-03-25 VITALS — BP 150/92 | HR 76 | Ht 62.75 in | Wt 237.1 lb

## 2012-03-25 DIAGNOSIS — R140 Abdominal distension (gaseous): Secondary | ICD-10-CM

## 2012-03-25 DIAGNOSIS — R142 Eructation: Secondary | ICD-10-CM

## 2012-03-25 DIAGNOSIS — K5900 Constipation, unspecified: Secondary | ICD-10-CM

## 2012-03-25 DIAGNOSIS — R143 Flatulence: Secondary | ICD-10-CM

## 2012-03-25 DIAGNOSIS — R109 Unspecified abdominal pain: Secondary | ICD-10-CM

## 2012-03-25 LAB — HEPATIC FUNCTION PANEL
AST: 13 U/L (ref 0–37)
Albumin: 3.9 g/dL (ref 3.5–5.2)
Total Bilirubin: 0.4 mg/dL (ref 0.3–1.2)

## 2012-03-25 LAB — CBC WITH DIFFERENTIAL/PLATELET
Basophils Relative: 0.4 % (ref 0.0–3.0)
Eosinophils Relative: 2.2 % (ref 0.0–5.0)
HCT: 38.3 % (ref 36.0–46.0)
Hemoglobin: 12.1 g/dL (ref 12.0–15.0)
Lymphs Abs: 4.5 10*3/uL — ABNORMAL HIGH (ref 0.7–4.0)
MCV: 84.2 fl (ref 78.0–100.0)
Monocytes Absolute: 0.5 10*3/uL (ref 0.1–1.0)
Monocytes Relative: 5.7 % (ref 3.0–12.0)
Neutro Abs: 3.5 10*3/uL (ref 1.4–7.7)
RBC: 4.55 Mil/uL (ref 3.87–5.11)
WBC: 8.7 10*3/uL (ref 4.5–10.5)

## 2012-03-25 LAB — BASIC METABOLIC PANEL
BUN: 7 mg/dL (ref 6–23)
Calcium: 9.5 mg/dL (ref 8.4–10.5)
GFR: 95.06 mL/min (ref >60.00)
Glucose, Bld: 93 mg/dL (ref 70–99)
Potassium: 3.8 mEq/L (ref 3.5–5.1)

## 2012-03-25 LAB — TSH: TSH: 0.99 u[IU]/mL (ref 0.35–5.50)

## 2012-03-25 MED ORDER — OMEPRAZOLE 40 MG PO CPDR: 40.0000 mg | DELAYED_RELEASE_CAPSULE | Freq: Two times a day (BID) | ORAL | Status: AC

## 2012-03-25 NOTE — Patient Instructions (Addendum)
Your physician has requested that you go to the basement for  lab work before leaving today.  We have sent the following medications to your pharmacy for you to pick up at your convenience:Omeprazole 40 mg twice daily.  You have been given Hemoccult cards to finish at home and mail back to [place] when finished.  You have been given a separate informational sheet regarding your tobacco use, the importance of quitting and local resources to help you quit.   You have been scheduled for a CT scan of the abdomen and pelvis at [facility] ([address]---this is in the same building as Architectural technologist).   You are scheduled on 03/26/12 at 10:30am. You should arrive 15 minutes prior to your appointment time for registration. Please follow the written instructions below on the day of your exam:  WARNING: IF YOU ARE ALLERGIC TO IODINE/X-RAY DYE, PLEASE NOTIFY RADIOLOGY IMMEDIATELY AT [phone]! YOU WILL BE GIVEN A 13 HOUR PREMEDICATION PREP.  1) Do not eat or drink anything after 6:30am (4 hours prior to your test) 2) You have been given 2 bottles of oral contrast to drink. The solution may taste better if refrigerated, but do NOT add ice or any other liquid to this solution. Shake well before drinking.    Drink 1 bottle of contrast @ 8:30am (2 hours prior to your exam)  Drink 1 bottle of contrast @ 9:30am (1 hour prior to your exam)  You may take any medications as prescribed with a small amount of water except for the following: Metformin, Glucophage, Glucovance, Avandamet, Riomet, Fortamet, Actoplus Met, Janumet, Glumetza or Metaglip. The above medications must be held the day of the exam AND 48 hours after the exam.  The purpose of you drinking the oral contrast is to aid in the visualization of your intestinal tract. The contrast solution may cause some diarrhea. Before your exam is started, you will be given a small amount of fluid to drink. Depending on your individual set of  symptoms, you may also receive an intravenous injection of x-ray contrast/dye. Plan on being at [facility] for 30 minutes or long, depending on the type of exam you are having performed.  If you have any questions regarding your exam or if you need to reschedule, you may call the CT department at [phone] between the hours of 8:00 am and 5:00 pm, Monday-Friday.  ________________________________________________________________________   cc: Rodman Pickle, MD

## 2012-03-25 NOTE — Progress Notes (Addendum)
History of Present Illness: This is a 61 year old female followed by Dr. Mikel Cella in the [facility] Medicine clinic. She has previously been evaluated by Dr. Ewing Schlein and underwent a screening colonoscopy in 2009 showing diverticulosis and hemorrhoids. It is not clear why she did not return to Dr. Ewing Schlein for evaluation of this GI problem. There is a telephone note indicating that she called [place] about colon cancer screening in 2010 however she is not previously been seen in our office. I cannot find any record of an evaluation of her current symptoms in the [facility] Medicine clinic. The patient relates a several month history of bloating and early satiety. She is treated with omeprazole for chronic GERD. She notes multiple food intolerances especially with meat some meats causing constipation and some meats causing diarrhea. Most recently she has had difficulties with constipation as she has been minimizing her meat intake.  Denies weight loss, abdominal pain, constipation, diarrhea, change in stool caliber, melena, hematochezia, nausea, vomiting, dysphagia, reflux symptoms, chest pain.  Review of Systems: Pertinent positive and negative review of systems were noted in the above HPI section. All other review of systems were otherwise negative.  Current Medications, Allergies, Past Medical History, Past Surgical History, Family History and Social History were reviewed in [employer] record.  Physical Exam: General: Well developed , well nourished, no acute distress Head: Normocephalic and atraumatic Eyes:  sclerae anicteric, EOMI Ears: Normal auditory acuity Mouth: No deformity or lesions Neck: Supple, no masses or thyromegaly Lungs: Clear throughout to auscultation Heart: Regular rate and rhythm; no murmurs, rubs or bruits Abdomen: Soft, mild upper abdominal tenderness to deep palpation without rebound or guarding and non distended. No masses, hepatosplenomegaly or hernias noted. Normal  Bowel sounds Musculoskeletal: Symmetrical with no gross deformities  Skin: No lesions on visible extremities Pulses:  Normal pulses noted Extremities: No clubbing, cyanosis, edema or deformities noted Neurological: Alert oriented x 4, grossly nonfocal Cervical Nodes:  No significant cervical adenopathy Inguinal Nodes: No significant inguinal adenopathy Psychological:  Alert and cooperative. Normal mood and affect  Assessment and Recommendations:  1. Abdominal bloating with abdominal pain and tenderness, early satiety and variable bowel habits brought on by certain foods. Symptoms may be related to reflux, gastroparesis, symptomatic cholelithiasis, constipation or another gastrointestinal problem. Obtain standard blood work, stool Hemoccults and schedule an abdominal/pelvic CT scan. Avoid foods that bring on GI symptoms. Intensify all antireflux measures and increased omeprazole to 40 mg twice a day. Begin MiraLax once or twice daily titrated for adequate bowel movements. Consider a gallbladder ultrasound, upper endoscopy and a gastric emptying scan based on symptom response and findings from above evaluation.  2. Colorectal cancer screening, average risk. Screening colonoscopy recommended at 10 years from her last colonoscopy, in 2019.

## 2012-03-26 ENCOUNTER — Ambulatory Visit (INDEPENDENT_AMBULATORY_CARE_PROVIDER_SITE_OTHER)
Admission: RE | Admit: 2012-03-26 | Discharge: 2012-03-26 | Disposition: A | Discharge status: Home / Self Care | Payer: Medicaid Other | Source: Ambulatory Visit | Attending: Gastroenterology | Admitting: Gastroenterology

## 2012-03-26 ENCOUNTER — Other Ambulatory Visit: Payer: Self-pay

## 2012-03-26 DIAGNOSIS — R140 Abdominal distension (gaseous): Secondary | ICD-10-CM

## 2012-03-26 DIAGNOSIS — K8020 Calculus of gallbladder without cholecystitis without obstruction: Secondary | ICD-10-CM

## 2012-03-26 DIAGNOSIS — R141 Gas pain: Secondary | ICD-10-CM

## 2012-03-26 DIAGNOSIS — R109 Unspecified abdominal pain: Secondary | ICD-10-CM

## 2012-03-26 MED ORDER — IOHEXOL 300 MG/ML  SOLN
100.0000 mL | Freq: Once | INTRAMUSCULAR | Status: AC | PRN
  Administered 2012-03-26: 100 mL via INTRAVENOUS

## 2012-03-31 ENCOUNTER — Ambulatory Visit (HOSPITAL_COMMUNITY)
Admission: RE | Admit: 2012-03-31 | Discharge: 2012-03-31 | Disposition: A | Discharge status: Home / Self Care | Payer: Medicare Other | Source: Ambulatory Visit | Attending: Gastroenterology | Admitting: Gastroenterology

## 2012-03-31 DIAGNOSIS — K8020 Calculus of gallbladder without cholecystitis without obstruction: Secondary | ICD-10-CM

## 2012-03-31 DIAGNOSIS — R109 Unspecified abdominal pain: Secondary | ICD-10-CM | POA: Insufficient documentation

## 2012-03-31 DIAGNOSIS — Q619 Cystic kidney disease, unspecified: Secondary | ICD-10-CM | POA: Insufficient documentation

## 2012-04-02 ENCOUNTER — Telehealth: Payer: Self-pay | Admitting: Family Medicine

## 2012-04-02 NOTE — Telephone Encounter (Signed)
Spoke with Ms. Pendry about the renal cyst. The radiologist read the cyst as benign appearing, I shared that with Ms. Heeg. I do not think there is anything to do at this time. We will keep an eye on it in a few years to see if it is enlarging or going away. She was happy to hear there is nothing to worry about. Nolawi Kanady M. Branda Chaudhary, M.D. 04/02/2012 5:09 PM

## 2012-04-02 NOTE — Telephone Encounter (Signed)
Had [place] and Dr Russella Dar told her that she had a cyst on her kidneys and she wants to talk to Dr Mikel Cella about this.  She is worried about it.

## 2012-04-02 NOTE — Telephone Encounter (Signed)
Attempted to return call at 2:25 PM and phone was busy. Will try again later.  Kou Gucciardo M. Adrianah Prophete, M.D.

## 2012-04-28 ENCOUNTER — Other Ambulatory Visit: Payer: Self-pay | Admitting: Family Medicine

## 2012-05-05 ENCOUNTER — Encounter: Payer: Self-pay | Admitting: Family Medicine

## 2012-05-05 ENCOUNTER — Ambulatory Visit (INDEPENDENT_AMBULATORY_CARE_PROVIDER_SITE_OTHER): Payer: Medicare Other | Admitting: Family Medicine

## 2012-05-05 VITALS — BP 112/75 | HR 88 | Temp 98.3°F | Ht 62.75 in | Wt 233.0 lb

## 2012-05-05 DIAGNOSIS — Z23 Encounter for immunization: Secondary | ICD-10-CM

## 2012-05-05 DIAGNOSIS — N281 Cyst of kidney, acquired: Secondary | ICD-10-CM

## 2012-05-05 DIAGNOSIS — Q619 Cystic kidney disease, unspecified: Secondary | ICD-10-CM

## 2012-05-05 DIAGNOSIS — I10 Essential (primary) hypertension: Secondary | ICD-10-CM

## 2012-05-05 DIAGNOSIS — E119 Type 2 diabetes mellitus without complications: Secondary | ICD-10-CM

## 2012-05-05 HISTORY — DX: Cyst of kidney, acquired: N28.1

## 2012-05-05 LAB — POCT GLYCOSYLATED HEMOGLOBIN (HGB A1C): Hemoglobin A1C: 6.3

## 2012-05-05 NOTE — Assessment & Plan Note (Signed)
A1C 6.3 today. Patient congratulated. Continue Metformin. Encouraged diet and exercise.

## 2012-05-05 NOTE — Patient Instructions (Addendum)
It was good to see you today. Your blood pressure and your diabetes are perfect. We will keep everything the same.  Please make an appointment to come back in February for a follow up appointment.  Let me know if you need anything.  Asmaa Tirpak M. Bryce Kimble, M.D.

## 2012-05-05 NOTE — Assessment & Plan Note (Signed)
Incidental finding. Benign appearance. Will reimage in 6 months. RTC prior to that for evaluation and radiology order. Patient agrees

## 2012-05-05 NOTE — Progress Notes (Signed)
Patient ID: KIMBREE CASANAS, female   DOB: [birth year], 61 y.o.   MRN: [id number] [name] Charly Hunton M. Malkia Nippert, MD Phone: [phone]  Subjective: HPI: Patient is a 61 y.o. female presenting to clinic today for follow up appointment. Concerns today include following up an incidental renal cyst finding  1. Hypertension Blood pressure today: 112/75 Taking Meds: Yes Side effects: None ROS: Denies headache, visual changes, nausea, vomiting, chest pain, abdominal pain or shortness of breath.  2. Diabetes:  No highs or lows reported. Feels good. Following low carb diet Taking medications: yes Side effects: None ROS: denies fever, chills, dizziness, loss of conscieness, polyuria poly dipsia numbness or tingling in extremities or chest pain.  3. Renal cyst: Incidental finding on Abd CT in early Sept 2013. Appears benign on CT scan. Told to follow up with PCP. No dysuria, abd pain, back pain. Creat was good at time of finding. Patient states her daughter has bilateral cysts but no other family history. No trauma to the area.  History Reviewed: Non-smoker. Health Maintenance: Needs flu shot  ROS: Please see HPI above.  Objective: Office vital signs reviewed.  Physical Examination:  General: Awake, alert. NAD HEENT: Atraumatic, normocephalic Neck: No LAD Pulm: CTAB, no wheezes Cardio: RRR, no murmurs appreciated Abdomen:+BS, soft, nontender, nondistended. No CVA tenderniess Extremities: No edema Neuro: Grossly intact  Assessment: 61 yo F follow up appointment  Plan: See Problem List and After Visit Summary

## 2012-05-05 NOTE — Assessment & Plan Note (Signed)
Well controlled. Continue current medication regimen.  

## 2012-06-02 ENCOUNTER — Other Ambulatory Visit: Payer: Self-pay | Admitting: Family Medicine

## 2012-06-30 ENCOUNTER — Other Ambulatory Visit: Payer: Self-pay | Admitting: Family Medicine

## 2012-07-01 ENCOUNTER — Other Ambulatory Visit (HOSPITAL_COMMUNITY)
Admission: RE | Admit: 2012-07-01 | Discharge: 2012-07-01 | Disposition: A | Discharge status: Home / Self Care | Payer: Medicare Other | Source: Ambulatory Visit | Attending: Family Medicine | Admitting: Family Medicine

## 2012-07-01 ENCOUNTER — Encounter: Payer: Self-pay | Admitting: Family Medicine

## 2012-07-01 ENCOUNTER — Ambulatory Visit (INDEPENDENT_AMBULATORY_CARE_PROVIDER_SITE_OTHER): Payer: Medicare Other | Admitting: Family Medicine

## 2012-07-01 VITALS — BP 171/93 | HR 70 | Temp 98.1°F | Ht 63.5 in | Wt 236.1 lb

## 2012-07-01 DIAGNOSIS — N898 Other specified noninflammatory disorders of vagina: Secondary | ICD-10-CM

## 2012-07-01 DIAGNOSIS — Z113 Encounter for screening for infections with a predominantly sexual mode of transmission: Secondary | ICD-10-CM | POA: Insufficient documentation

## 2012-07-01 LAB — POCT WET PREP (WET MOUNT)

## 2012-07-01 NOTE — Patient Instructions (Addendum)
It was nice to meet you.  Please be sure to make an appointment to see Dr. Mikel Cella for a check up in about 1 month.   I will call you with your lab results.

## 2012-07-02 ENCOUNTER — Telehealth: Payer: Self-pay | Admitting: Family Medicine

## 2012-07-02 MED ORDER — METRONIDAZOLE 500 MG PO TABS: 500.0000 mg | ORAL_TABLET | Freq: Three times a day (TID) | ORAL | Status: DC

## 2012-07-02 NOTE — Telephone Encounter (Signed)
Called, left voice mail, Wet Prep showed BV, not an STD, Rx for flagyl sent to pharmacy.  Asked her to call if she has questions.

## 2012-07-02 NOTE — Progress Notes (Signed)
  Subjective:    Patient ID: Alicia [name], female    DOB: Feb 09, 1951, 61 y.o.   MRN: [id number]  HPI  Alicia [name] comes in for vaginal discharge x1-2 weeks.  She says it is itchy and has a bad odor.  She denies new sexual contacts, says as far as she knows she is monogamous with her boyfriend. She does not have a history of recurrent vaginal infections, she has not been on any antibiotics lately.  She denies dysuria, abdominal pain, back pain, and fevers.   Review of Systems Pertinent items in HPI    Objective:   Physical Exam BP 171/93  Pulse 70  Temp 98.1 F (36.7 C) (Oral)  Ht 5' 3.5" (1.613 m)  Wt 236 lb 1.6 oz (107.094 kg)  BMI 41.17 kg/m2 General appearance: alert, cooperative and no distress Abdomen: soft, non-tender; bowel sounds normal; no masses,  no organomegaly Pelvic: cervix normal in appearance, external genitalia normal, no adnexal masses or tenderness, no cervical motion tenderness, rectovaginal septum normal and uterus normal size, shape, and consistency, there is a thin grey, odorous discharge.        Assessment & Plan:

## 2012-07-31 ENCOUNTER — Ambulatory Visit (INDEPENDENT_AMBULATORY_CARE_PROVIDER_SITE_OTHER): Payer: Medicare Other | Admitting: Family Medicine

## 2012-07-31 ENCOUNTER — Encounter: Payer: Self-pay | Admitting: Family Medicine

## 2012-07-31 VITALS — BP 170/90 | HR 76 | Temp 98.1°F | Ht 62.75 in | Wt 226.5 lb

## 2012-07-31 DIAGNOSIS — L309 Dermatitis, unspecified: Secondary | ICD-10-CM | POA: Insufficient documentation

## 2012-07-31 DIAGNOSIS — L259 Unspecified contact dermatitis, unspecified cause: Secondary | ICD-10-CM

## 2012-07-31 DIAGNOSIS — I10 Essential (primary) hypertension: Secondary | ICD-10-CM

## 2012-07-31 DIAGNOSIS — N281 Cyst of kidney, acquired: Secondary | ICD-10-CM

## 2012-07-31 DIAGNOSIS — Q619 Cystic kidney disease, unspecified: Secondary | ICD-10-CM

## 2012-07-31 DIAGNOSIS — E119 Type 2 diabetes mellitus without complications: Secondary | ICD-10-CM

## 2012-07-31 LAB — POCT GLYCOSYLATED HEMOGLOBIN (HGB A1C): Hemoglobin A1C: 6

## 2012-07-31 MED ORDER — HYDROCHLOROTHIAZIDE 25 MG PO TABS: 25.0000 mg | ORAL_TABLET | Freq: Every day | ORAL | Status: DC

## 2012-07-31 MED ORDER — AMLODIPINE BESYLATE 10 MG PO TABS: 10.0000 mg | ORAL_TABLET | Freq: Every day | ORAL | Status: DC

## 2012-07-31 MED ORDER — SIMVASTATIN 20 MG PO TABS: 20.0000 mg | ORAL_TABLET | Freq: Every day | ORAL | Status: DC

## 2012-07-31 MED ORDER — BENAZEPRIL HCL 40 MG PO TABS: 40.0000 mg | ORAL_TABLET | Freq: Every day | ORAL | Status: DC

## 2012-07-31 MED ORDER — METFORMIN HCL 1000 MG PO TABS: 1000.0000 mg | ORAL_TABLET | Freq: Two times a day (BID) | ORAL | Status: DC

## 2012-07-31 NOTE — Patient Instructions (Addendum)
It was good to see you today. It looks like everything is going well.  Please take your blood pressure medicine EVERY DAY.  Also, I have ordered a renal ultrasound for you to have done. Please go to the hospital in February to have that exam done. I will call you when I get those results.  Come back to see me one morning after April 19 for your next follow up and lab work.   For your rash, keep it well moisturized. You can also use OTC hydrocortisone cream for itching.  I have refilled all of your medications.   Amber M. Hairford, M.D.

## 2012-07-31 NOTE — Assessment & Plan Note (Signed)
A1C at goal 6.0. Continue current regimen. RTC in 3 months for follow up. Also congratulated on weight loss of 10lbs in last month.

## 2012-07-31 NOTE — Assessment & Plan Note (Signed)
Rash most consistent with dry skin. Moisturize well and use hydrocortisone cream as needed. Red flag symptoms discussed including fevers, spreading of rash or pain.

## 2012-07-31 NOTE — Assessment & Plan Note (Signed)
Incidental finding in Sept 2013. No symptoms. Will follow up with renal ultrasound in Feb. I will call patient with these results.

## 2012-07-31 NOTE — Assessment & Plan Note (Signed)
BP not at goal, but patient states she is not compliant with medications. Meds refilled, and she will return for BP check. Will do full labs in April 2014.

## 2012-07-31 NOTE — Progress Notes (Signed)
Patient ID: Alicia [name], female   DOB: 1951-04-21, 62 y.o.   MRN: [id number] Alicia [name] Alicia Wahler M. [name], MD Phone: [phone]  Subjective: HPI: Patient is a 62 y.o. female presenting to [name] today for follow up. Concerns today include: medication refills, worsening vision and rash on chest, cyst follow up  1. Hypertension: Patient states she has a lot on her mind and it is stressing her out Blood pressure at home: Not as high as here, per patient Blood pressure today: 170/90, recheck  Taking Meds: Takes BP medicine 3-4 days/week. States she doesn't take it when she's out of town, but also states she has been out of medications for a few weeks Side effects: None ROS: Denies headache,nausea, vomiting, chest pain, abdominal pain or shortness of breath.  2. Diabetes: One year since eye exam, time due. A1C done today High at home: 129 Low at home: 104 - No symptomatic lows Taking medications: Taking medicines Side effects: None ROS: denies fever, chills, dizziness, polyuria poly dipsia numbness or tingling in extremities or chest pain.  3. Rash: Rash on chest. No new soaps, lotions or perfumes. Rash been there for a few weeks, went away but came back. Hasn't tried anything on it. Never had before. Some itching but not always. Has noticed it has turned more brown.  4. Renal cyst- incidental finding in Sept. Will need follow up US in Feb  History Reviewed: 1/3 ppd smoker. Health Maintenance: Up to date on Flu shot. Last colonoscopy >10 years ago  ROS: Please see HPI above.  Objective: Office vital signs reviewed.  Physical Examination:  General: Awake, alert. NAD HEENT: Atraumatic, normocephalic.  Neck: No masses palpated. No LAD Pulm: CTAB, no wheezes Cardio: RRR, no murmurs appreciated Abdomen:+BS, soft, nontender, nondistended Skin: Hyperpigmented patches on chest with few small pustules Extremities: No edema Neuro: Grossly  intact  Assessment: 62 yo F follow up appointment  Plan: See Problem List and After Visit Summary

## 2012-08-31 ENCOUNTER — Ambulatory Visit (HOSPITAL_COMMUNITY)
Admission: RE | Admit: 2012-08-31 | Discharge: 2012-08-31 | Disposition: A | Discharge status: Home / Self Care | Payer: Medicare Other | Source: Ambulatory Visit | Attending: Family Medicine | Admitting: Family Medicine

## 2012-08-31 DIAGNOSIS — N281 Cyst of kidney, acquired: Secondary | ICD-10-CM

## 2012-08-31 DIAGNOSIS — Q619 Cystic kidney disease, unspecified: Secondary | ICD-10-CM | POA: Insufficient documentation

## 2012-09-07 ENCOUNTER — Telehealth: Payer: Self-pay | Admitting: Family Medicine

## 2012-09-07 NOTE — Telephone Encounter (Signed)
Would like to know results of her US from last week °

## 2012-09-08 NOTE — Telephone Encounter (Signed)
Left VM stating results. Cysts present but unchanged.

## 2012-12-01 ENCOUNTER — Encounter: Payer: Self-pay | Admitting: Family Medicine

## 2012-12-01 ENCOUNTER — Ambulatory Visit (INDEPENDENT_AMBULATORY_CARE_PROVIDER_SITE_OTHER): Payer: Medicare Other | Admitting: Family Medicine

## 2012-12-01 VITALS — BP 150/85 | HR 81 | Ht 63.0 in | Wt 219.0 lb

## 2012-12-01 DIAGNOSIS — F329 Major depressive disorder, single episode, unspecified: Secondary | ICD-10-CM

## 2012-12-01 DIAGNOSIS — E119 Type 2 diabetes mellitus without complications: Secondary | ICD-10-CM

## 2012-12-01 DIAGNOSIS — I10 Essential (primary) hypertension: Secondary | ICD-10-CM

## 2012-12-01 LAB — COMPREHENSIVE METABOLIC PANEL
AST: 14 U/L (ref 0–37)
Albumin: 4.1 g/dL (ref 3.5–5.2)
Alkaline Phosphatase: 121 U/L — ABNORMAL HIGH (ref 39–117)
Potassium: 3.8 mEq/L (ref 3.5–5.3)
Sodium: 143 mEq/L (ref 135–145)
Total Protein: 6.9 g/dL (ref 6.0–8.3)

## 2012-12-01 LAB — LDL CHOLESTEROL, DIRECT: Direct LDL: 124 mg/dL — ABNORMAL HIGH

## 2012-12-01 LAB — CBC
MCH: 26.7 pg (ref 26.0–34.0)
MCHC: 32.9 g/dL (ref 30.0–36.0)
MCV: 81.2 fL (ref 78.0–100.0)
Platelets: 267 10*3/uL (ref 150–400)

## 2012-12-01 LAB — POCT GLYCOSYLATED HEMOGLOBIN (HGB A1C): Hemoglobin A1C: 5.9

## 2012-12-01 MED ORDER — AMLODIPINE BESYLATE 10 MG PO TABS: 10.0000 mg | ORAL_TABLET | Freq: Every day | ORAL | Status: DC

## 2012-12-01 MED ORDER — BENAZEPRIL HCL 40 MG PO TABS: 40.0000 mg | ORAL_TABLET | Freq: Every day | ORAL | Status: DC

## 2012-12-01 MED ORDER — METFORMIN HCL 1000 MG PO TABS: 1000.0000 mg | ORAL_TABLET | Freq: Two times a day (BID) | ORAL | Status: DC

## 2012-12-01 MED ORDER — SIMVASTATIN 20 MG PO TABS: 20.0000 mg | ORAL_TABLET | Freq: Every day | ORAL | Status: DC

## 2012-12-01 MED ORDER — HYDROCHLOROTHIAZIDE 25 MG PO TABS: 25.0000 mg | ORAL_TABLET | Freq: Every day | ORAL | Status: DC

## 2012-12-01 NOTE — Progress Notes (Signed)
Patient ID: Alicia [name], female   DOB: 03/25/1951, 62 y.o.   MRN: [id number]  [name] Denica Web M. Kortez Murtagh, MD Phone: [phone]   Subjective: HPI: Patient is a 62 y.o. female presenting to clinic today for follow up appointment. Concerns today include: None  1. Hypertension Blood pressure at home: 150's Blood pressure today:  150/85 Taking Meds: Yes, Benazepril and HCTZ Side effects: None ROS: Denies headache, visual changes, nausea, vomiting, chest pain, abdominal pain or shortness of breath.  2. Diabetes:  High at home: 130 Low at home: 90's, no symptomatic lows Taking medications: Yes, Metformin Side effects: None ROS: denies fever, chills, dizziness, LOC, polyuria, polydipsia, numbness or tingling in extremities or chest pain. Last eye exam: Feb 2014. Dr. Mitzi Davenport on [facility] Last foot exam: Due today Nephropathy screen indicated?: On ACE Last flu, zoster and/or pneumovax: Needs pneumovax and zoster  3. Mental health- Followed by [facility]er. States her only change is an increase in Lamotrigine. Mood is good. She states she has a new boyfriend and she is happy.   History Reviewed: Some day smoker. Health Maintenance: Will need TDap, Mammo, Colonoscopy and Zostavax  ROS: Please see HPI above.  Objective: Office vital signs reviewed. BP 150/85  Pulse 81  Ht 5\' 3"  (1.6 m)  Wt 219 lb (99.338 kg)  BMI 38.8 kg/m2  Physical Examination:  General: Awake, alert. NAD. Talkative HEENT: Atraumatic, normocephalic. MMM Neck: No masses palpated. No LAD Pulm: CTAB, no wheezes Cardio: RRR, no murmurs appreciated Abdomen:+BS, soft, nontender, nondistended Extremities: No edema Neuro: Grossly intact  Assessment: 62 y.o. female follow up  Plan: See Problem List and After Visit Summary

## 2012-12-01 NOTE — Patient Instructions (Signed)
Your A1C is less than 6.0 today. Keep dieting, but you can stop your Metformin.   If your labs are abnormal I will call, otherwise I will sent you a letter.   I will see you back in 3 months to check your blood pressure and your weight. Keep up the good work!  Myrical Andujo M. Sherma Vanmetre, M.D.

## 2012-12-03 ENCOUNTER — Encounter: Payer: Self-pay | Admitting: Family Medicine

## 2012-12-03 NOTE — Assessment & Plan Note (Signed)
A1C <6. Patient is very happy with this news! She is losing weight and I Have given her the option to try to stop Metformin for now. She states she will stop, continue to check CBG on occasion and f/u in 3 months. If A1C goes back up we will restart metformin, but I am pleased with how she is doing.

## 2012-12-03 NOTE — Assessment & Plan Note (Signed)
Followed by mental health. Appears to be doing very well today. No concerns at this time. Will f/u with mental health.

## 2012-12-03 NOTE — Assessment & Plan Note (Signed)
BP improved and right at goal today. Continue current medications. Will check routine labs including direct LDL today.

## 2013-01-28 ENCOUNTER — Other Ambulatory Visit: Payer: Self-pay

## 2013-04-06 ENCOUNTER — Encounter: Payer: Self-pay | Admitting: Family Medicine

## 2013-04-06 ENCOUNTER — Ambulatory Visit (INDEPENDENT_AMBULATORY_CARE_PROVIDER_SITE_OTHER): Payer: Medicare Other | Admitting: Family Medicine

## 2013-04-06 VITALS — BP 175/108 | HR 71 | Temp 98.6°F | Ht 63.0 in | Wt 208.0 lb

## 2013-04-06 DIAGNOSIS — I10 Essential (primary) hypertension: Secondary | ICD-10-CM

## 2013-04-06 DIAGNOSIS — E119 Type 2 diabetes mellitus without complications: Secondary | ICD-10-CM

## 2013-04-06 DIAGNOSIS — F329 Major depressive disorder, single episode, unspecified: Secondary | ICD-10-CM

## 2013-04-06 MED ORDER — HYDROCHLOROTHIAZIDE 25 MG PO TABS: 25.0000 mg | ORAL_TABLET | Freq: Every day | ORAL | Status: DC

## 2013-04-06 MED ORDER — BENAZEPRIL HCL 40 MG PO TABS: 40.0000 mg | ORAL_TABLET | Freq: Every day | ORAL | Status: DC

## 2013-04-06 MED ORDER — SIMVASTATIN 20 MG PO TABS: 20.0000 mg | ORAL_TABLET | Freq: Every day | ORAL | Status: DC

## 2013-04-06 MED ORDER — AMLODIPINE BESYLATE 10 MG PO TABS: 10.0000 mg | ORAL_TABLET | Freq: Every day | ORAL | Status: DC

## 2013-04-06 NOTE — Patient Instructions (Addendum)
Congratulations on your weight loss! Keep up the walking.  I have refilled your medications.  Come back next month for your flu shot.  Please schedule a mammogram.   I will see you back in 3 months, or sooner if needed!  Arlean Thies M. Patriciaann Rabanal, M.D.

## 2013-04-06 NOTE — Assessment & Plan Note (Signed)
Stable. Followed by [facility]er.

## 2013-04-06 NOTE — Assessment & Plan Note (Signed)
BP elevated today, but she was talking and moving during triage. She has no symptoms associated with this and previously better controlled. F/u at next visit and consider increasing medication if needed.

## 2013-04-06 NOTE — Progress Notes (Signed)
Patient ID: Alicia [name], female   DOB: 26-Mar-1951, 62 y.o.   MRN: [id number]  [name] Juleen Sorrels M. Jhovany Weidinger, MD Phone: [phone]   Subjective: HPI: Patient is a 62 y.o. female presenting to clinic today for follow up appointment.  1. Hypertension: Blood pressure at home: Does not check frequently, but usually in the 150's Blood pressure today: 175/108 today in triage Taking Meds: Norvasc, Lotensin, HCTZ - Missed a few doses recently due to fatigue Side effects: None ROS: Denies headache, visual changes, nausea, vomiting, chest pain, abdominal pain or shortness of breath.  2. Diabetes: Congratulated on weight loss! Does not check blood sugar regularly.  Taking medications: No medication needed.  ROS: denies fever, chills, dizziness, LOC, polyuria, polydipsia, numbness or tingling in extremities or chest pain. Eye exam: Due now Nephropathy screen indicated?: On ACE  3. Depression:  On Seroquel now. No SI/HI. Followed by Vesta Mixer  History Reviewed: Daily smoker. Health Maintenance: Needs flu shot (declined today, but states she will get in October) and mammogram  ROS: Please see HPI above.  Objective: Office vital signs reviewed. BP 175/108  Pulse 71  Temp(Src) 98.6 F (37 C) (Oral)  Ht 5\' 3"  (1.6 m)  Wt 208 lb (94.348 kg)  BMI 36.85 kg/m2  Physical Examination:  General: Awake, alert. NAD HEENT: Atraumatic, normocephalic. Wears glasses. MMM. Neck: No masses palpated. No LAD Pulm: CTAB, no wheezes Cardio: RRR, no murmurs appreciated Abdomen:+BS, soft, nontender, nondistended Extremities: No edema Neuro: Grossly intact  Assessment: 62 y.o. female follow up exam  Plan: See Problem List and After Visit Summary

## 2013-04-06 NOTE — Assessment & Plan Note (Signed)
A1c still 5.9. Will resolved DM as a problem and follow Bmet for hyperglycemia. Encouraged to continue diet and exercise.

## 2013-05-05 ENCOUNTER — Other Ambulatory Visit: Payer: Self-pay

## 2013-05-05 DIAGNOSIS — Z1231 Encounter for screening mammogram for malignant neoplasm of breast: Secondary | ICD-10-CM

## 2013-05-25 ENCOUNTER — Ambulatory Visit (INDEPENDENT_AMBULATORY_CARE_PROVIDER_SITE_OTHER): Payer: Medicare Other | Admitting: *Deleted

## 2013-05-25 DIAGNOSIS — Z23 Encounter for immunization: Secondary | ICD-10-CM

## 2013-06-01 ENCOUNTER — Ambulatory Visit
Admission: RE | Admit: 2013-06-01 | Discharge: 2013-06-01 | Disposition: A | Discharge status: Home / Self Care | Payer: Medicare Other | Source: Ambulatory Visit

## 2013-06-01 DIAGNOSIS — Z1231 Encounter for screening mammogram for malignant neoplasm of breast: Secondary | ICD-10-CM

## 2014-01-10 ENCOUNTER — Telehealth: Payer: Self-pay | Admitting: Family Medicine

## 2014-01-12 NOTE — Telephone Encounter (Signed)
Call Pt about DM care. When Pt calls back please schedule appointment to check LDL and A1C.  ° °

## 2014-05-04 ENCOUNTER — Encounter: Payer: Self-pay | Admitting: Obstetrics and Gynecology

## 2014-05-04 ENCOUNTER — Ambulatory Visit (INDEPENDENT_AMBULATORY_CARE_PROVIDER_SITE_OTHER): Payer: Medicare Other | Admitting: Obstetrics and Gynecology

## 2014-05-04 VITALS — BP 178/84 | HR 71 | Temp 98.1°F | Wt 211.0 lb

## 2014-05-04 DIAGNOSIS — F32A Depression, unspecified: Secondary | ICD-10-CM

## 2014-05-04 DIAGNOSIS — Z23 Encounter for immunization: Secondary | ICD-10-CM

## 2014-05-04 DIAGNOSIS — E785 Hyperlipidemia, unspecified: Secondary | ICD-10-CM

## 2014-05-04 DIAGNOSIS — E119 Type 2 diabetes mellitus without complications: Secondary | ICD-10-CM | POA: Diagnosis present

## 2014-05-04 DIAGNOSIS — I10 Essential (primary) hypertension: Secondary | ICD-10-CM

## 2014-05-04 DIAGNOSIS — F17200 Nicotine dependence, unspecified, uncomplicated: Secondary | ICD-10-CM

## 2014-05-04 DIAGNOSIS — F329 Major depressive disorder, single episode, unspecified: Secondary | ICD-10-CM

## 2014-05-04 DIAGNOSIS — Z720 Tobacco use: Secondary | ICD-10-CM | POA: Diagnosis not present

## 2014-05-04 LAB — POCT GLYCOSYLATED HEMOGLOBIN (HGB A1C): HEMOGLOBIN A1C: 5.9

## 2014-05-04 MED ORDER — SIMVASTATIN 20 MG PO TABS: 20.0000 mg | ORAL_TABLET | Freq: Every day | ORAL | Status: DC

## 2014-05-04 MED ORDER — AMLODIPINE BESYLATE 10 MG PO TABS: 10.0000 mg | ORAL_TABLET | Freq: Every day | ORAL | Status: DC

## 2014-05-04 MED ORDER — NICOTINE 7 MG/24HR TD PT24: 7.0000 mg | MEDICATED_PATCH | Freq: Every day | TRANSDERMAL | Status: DC

## 2014-05-04 NOTE — Assessment & Plan Note (Signed)
Patient discontinued taking her statin since last clinic visit. Will recheck lipid panel today. Started her back on Zocar 20mg  for now and will readjust depending on lab work.

## 2014-05-04 NOTE — Patient Instructions (Addendum)
Ms. Jenelle Mageshairston it was great to see you today!  I am pleased to hear that things are going well for you. Please keep up the good work with the weight loss, eating healthy, and exercise. It really is doing you well.  Here are some of the things we discussed today: -I will be restarting you on one BP medication - Amlodipine -I am also starting you back on you statin for your cholesterol -You received to vaccines today. Please monitor for any adverse reactions. -I will call you with the results of your labs -Please review the paper work I gave you and we can discuss it at your next visit.  Please schedule a follow-up appointment for 1 month so I can recheck your BPs and see how you are doing on the medications we restarted.    Thanks for allowing me to be a part of your care! Dr. Doroteo GlassmanPhelps

## 2014-05-04 NOTE — Progress Notes (Signed)
Patient ID: Alicia [name], female   DOB: 01-30-1951, 63 y.o.   MRN: [id number]     Subjective: Chief Complaint  Patient presents with  . Annual Exam   Has not been to the doctor in over a year and stopped taking all meds.   HPI: Alicia [name] is a 63 y.o. presenting to clinic today for annual exam. She states that she has not been to the doctor in over a year and thus she is no longer on any of her medications because they ran out and she had no more refills. Other items we discussed are below:  #Depression screen was positive. Patient states that she has been doing ok. Only time she gets depressed is when talking to children because they stress her out and make their problems her problems. She states that she doesn't feel sad just worried and anxious about situations. She states she is still using all of her mental health medications and goes to see her mental health doctor.   #Hypertension Blood pressure at home: does not check Blood pressure today: 178/84 Taking Meds: No, stopped taking all meds Side effects: None ROS: Denies headache, dizziness, visual changes, nausea, vomiting, chest pain, abdominal pain or shortness of breath.  #Health Maintenance: Patient due for flu, tetanus, zostavax, and pneumovax.    Social - Patient current everyday smoker. She wants to quit. She has quit in the past cold [place] but believes she will need some assistance this time to quit.  All systems were reviewed and were negative unless otherwise noted in the HPI Past Medical, Surgical, Social, and Family History Reviewed & Updated per EMR.   Objective: BP 178/84  Pulse 71  Temp(Src) 98.1 F (36.7 C) (Oral)  Wt 211 lb (95.709 kg)  General: alert, well-developed, NAD, cooperative Neck: supple, full ROM, no thyromegaly. No deformities, masses, or tenderness noted.  Lungs: CTAB, normal respiratory effort, no accessory muscle use, no crackles, and no wheezes.  Heart: RRR, no M/R/G.    Abdomen: soft and nontender.  Extremities: No cyanosis, clubbing, edema. Neurologic: No focal deficits Skin: Intact without suspicious lesions or rashes. Warm and dry. Psych: Mood and affect are normal; no evidence of anxiety or depression.  Assessment/Plan: Please see problem based Assessment and Plan  Health Maintainance: flu shot and pneumovax given today  In chart it says that patient has history of diabetes. Based off all her A1c's she has never met diagnosis for DM type 2. I removed this from her problem list. A1c checked today was 5.9. Will monitor.    Luiz Blare, DO 05/04/2014, 1:55 PM PGY-1, [facility]

## 2014-05-04 NOTE — Assessment & Plan Note (Addendum)
Elevated BP today 178/84. Patient had stopped all her medications since last clinic visit. She was on benazepril, HCTZ, and amlodipine. Started back Amlodipine only due to patient having continued weight loss goals. Will recheck BPs in 1 month and adjust medications appropriately. BMP ordered.

## 2014-05-04 NOTE — Assessment & Plan Note (Signed)
Stable. Patient followed my [facility]. She receives her mental health medications through them.

## 2014-05-04 NOTE — Assessment & Plan Note (Signed)
Patient expressed desire to change and quit smoking. Current every day smoker of about 6 cigs/day. Nicotine patch prescribed. Patient stated she will not smoke on patch.

## 2014-05-05 LAB — LIPID PANEL
CHOL/HDL RATIO: 3.4 ratio
CHOLESTEROL: 188 mg/dL (ref 0–200)
HDL: 56 mg/dL (ref >39)
LDL Cholesterol: 107 mg/dL — ABNORMAL HIGH (ref 0–99)
Triglycerides: 126 mg/dL (ref <150)
VLDL: 25 mg/dL (ref 0–40)

## 2014-05-05 LAB — BASIC METABOLIC PANEL
BUN: 6 mg/dL (ref 6–23)
CHLORIDE: 105 meq/L (ref 96–112)
CO2: 29 mEq/L (ref 19–32)
CREATININE: 0.65 mg/dL (ref 0.50–1.10)
Calcium: 10.2 mg/dL (ref 8.4–10.5)
Glucose, Bld: 91 mg/dL (ref 70–99)
Potassium: 4.3 mEq/L (ref 3.5–5.3)
SODIUM: 139 meq/L (ref 135–145)

## 2014-05-06 ENCOUNTER — Ambulatory Visit: Payer: Medicare Other

## 2014-05-11 ENCOUNTER — Encounter: Payer: Self-pay | Admitting: Obstetrics and Gynecology

## 2014-05-12 ENCOUNTER — Telehealth: Payer: Self-pay | Admitting: Obstetrics and Gynecology

## 2014-05-12 NOTE — Telephone Encounter (Signed)
Spoke with pharmacy and computer was running it through as cash pay.  They also don't have on file her medicaid number but they don't cover OTC medications.  Pt is aware of this and will be taking her current Medicare card to pharmacy since she was told they don't have it on file either.  Informed patient that she should check her formulary and see what type of "nicotine" medication is required.  She plans to do this before even going back to the pharmacy.  Will call us with what is covered.  Jazmin Hartsell,CMA

## 2014-05-12 NOTE — Telephone Encounter (Signed)
Pt called because Medicare is not paying for her Nicoderm. She wanted to know why when they uses to pay for them. Please call to the pharmacy and see what can be done so that she can get this or if there is an alternative. If she doesn't answer please leave a detailed message. jw

## 2014-08-09 ENCOUNTER — Ambulatory Visit (INDEPENDENT_AMBULATORY_CARE_PROVIDER_SITE_OTHER): Payer: Medicare Other | Admitting: Obstetrics and Gynecology

## 2014-08-09 ENCOUNTER — Encounter: Payer: Self-pay | Admitting: Obstetrics and Gynecology

## 2014-08-09 VITALS — BP 134/85 | HR 71 | Temp 98.5°F | Ht 63.0 in | Wt 224.0 lb

## 2014-08-09 DIAGNOSIS — Z720 Tobacco use: Secondary | ICD-10-CM

## 2014-08-09 DIAGNOSIS — E785 Hyperlipidemia, unspecified: Secondary | ICD-10-CM

## 2014-08-09 DIAGNOSIS — F17200 Nicotine dependence, unspecified, uncomplicated: Secondary | ICD-10-CM

## 2014-08-09 DIAGNOSIS — I10 Essential (primary) hypertension: Secondary | ICD-10-CM

## 2014-08-09 NOTE — Progress Notes (Signed)
     Subjective: Chief Complaint  Patient presents with  . Hypertension     HPI: Alicia [name] is a 64 y.o. presenting to clinic today to discuss the following:  Hypertension: Blood pressure at home: She does not record her blood pressures. Blood pressure today: 134/85 Taking Meds: amlodipine Side effects: None ROS: Denies headache, dizziness, visual changes, nausea, vomiting, chest pain, abdominal pain or shortness of breath.  Tobacco cessation: Patient states that since last seeing her at her last appointment she has quit smoking tobacco. She said her last cigarette was the day after Thanksgiving. Patient says she has no desire or taste to smoke. She used the nicotine patches for one week and no longer needs those either.   HLD: Patient's blood work in October showed elevated LDL level. Patient was started on statin. However, patient says she has stopped taking medication. She believes that she can get her levels down with "prayer" and her spirituality. She declines wanting to take medication at this time.    All systems were reviewed and were negative unless otherwise noted in the HPI Past Medical, Surgical, Social, and Family History Reviewed & Updated per EMR.   Objective: BP 134/85 mmHg  Pulse 71  Temp(Src) 98.5 F (36.9 C) (Oral)  Ht 5\' 3"  (1.6 m)  Wt 224 lb (101.606 kg)  BMI 39.69 kg/m2  Physical Exam General: alert, well-developed, NAD, cooperative Neck: supple, full ROM, no thyromegaly. No deformities, masses, or tenderness noted.  Lungs: CTAB, normal respiratory effort, no accessory muscle use, no crackles, and no wheezes.  Heart: RRR, no M/R/G.  Abdomen: soft and nontender.  Extremities: No cyanosis, clubbing, edema. Neurologic: No focal deficits Skin: Intact without suspicious lesions or rashes. Warm and dry. Psych: Mood and affect are normal; no evidence of anxiety or depression.  Assessment/Plan: Please see problem based Assessment and  Plan   Alicia AdaJazma Phelps, DO 08/09/2014, 6:51 PM PGY-1, [facility]

## 2014-08-09 NOTE — Patient Instructions (Signed)
Ms. Alicia [name] it was great to see you today!  I am pleased to hear that things are going well for you.  Here are some of the things we discussed today: -I think it would be great for you to start that cholesterol medication but we can continue to have discussions about it. -Continue with the exercising, weight loss, and healthy eating.   Congratulations on quitting smoking!! Happy for you.  Please schedule a follow-up appointment as needed.   Thanks for allowing me to be a part of your care! Dr. Doroteo GlassmanPhelps

## 2014-08-11 NOTE — Assessment & Plan Note (Signed)
Patient no longer smoking. She is now ~2 months out from her last cigarette. Patient has no desire to resume. Congratulated her and encouraged her.

## 2014-08-11 NOTE — Assessment & Plan Note (Signed)
A: BP 134/85. At goal <140/90. P: Continue amlodipine. If another agent warranted would add an ACEi. Patient doing well on medication without side effects.

## 2014-08-11 NOTE — Assessment & Plan Note (Signed)
Patient again has stopped taking her statin. She says she does not want to be on the medication at this time. She is very spiritual and believes her faith will heal her. I counseled her on the risk of not taking statin. I advised that if she wanted to go this route that diet and exercise will be important to making sure she stays healthy. Will continue to monitor.

## 2015-02-08 ENCOUNTER — Emergency Department (HOSPITAL_COMMUNITY)
Admission: EM | Admit: 2015-02-08 | Discharge: 2015-02-08 | Disposition: A | Discharge status: Home / Self Care | Payer: Medicare Other | Attending: Emergency Medicine | Admitting: Emergency Medicine

## 2015-02-08 ENCOUNTER — Emergency Department (HOSPITAL_COMMUNITY): Payer: Medicare Other

## 2015-02-08 DIAGNOSIS — Z87891 Personal history of nicotine dependence: Secondary | ICD-10-CM | POA: Diagnosis not present

## 2015-02-08 DIAGNOSIS — Z8669 Personal history of other diseases of the nervous system and sense organs: Secondary | ICD-10-CM | POA: Diagnosis not present

## 2015-02-08 DIAGNOSIS — I509 Heart failure, unspecified: Secondary | ICD-10-CM | POA: Diagnosis not present

## 2015-02-08 DIAGNOSIS — M25511 Pain in right shoulder: Secondary | ICD-10-CM | POA: Insufficient documentation

## 2015-02-08 DIAGNOSIS — E119 Type 2 diabetes mellitus without complications: Secondary | ICD-10-CM | POA: Diagnosis not present

## 2015-02-08 DIAGNOSIS — F329 Major depressive disorder, single episode, unspecified: Secondary | ICD-10-CM | POA: Insufficient documentation

## 2015-02-08 DIAGNOSIS — F209 Schizophrenia, unspecified: Secondary | ICD-10-CM | POA: Diagnosis not present

## 2015-02-08 DIAGNOSIS — I10 Essential (primary) hypertension: Secondary | ICD-10-CM | POA: Diagnosis not present

## 2015-02-08 DIAGNOSIS — Z8719 Personal history of other diseases of the digestive system: Secondary | ICD-10-CM | POA: Diagnosis not present

## 2015-02-08 DIAGNOSIS — Z79899 Other long term (current) drug therapy: Secondary | ICD-10-CM | POA: Insufficient documentation

## 2015-02-08 DIAGNOSIS — M7918 Myalgia, other site: Secondary | ICD-10-CM

## 2015-02-08 DIAGNOSIS — E669 Obesity, unspecified: Secondary | ICD-10-CM | POA: Insufficient documentation

## 2015-02-08 DIAGNOSIS — Z8739 Personal history of other diseases of the musculoskeletal system and connective tissue: Secondary | ICD-10-CM | POA: Diagnosis not present

## 2015-02-08 MED ORDER — DIPHENHYDRAMINE HCL 25 MG PO CAPS
25.0000 mg | ORAL_CAPSULE | Freq: Once | ORAL | Status: AC
  Administered 2015-02-08: 25 mg via ORAL
  Filled 2015-02-08: qty 1

## 2015-02-08 MED ORDER — OXYCODONE-ACETAMINOPHEN 5-325 MG PO TABS
1.0000 | ORAL_TABLET | Freq: Once | ORAL | Status: AC
  Administered 2015-02-08: 1 via ORAL
  Filled 2015-02-08: qty 1

## 2015-02-08 MED ORDER — METHOCARBAMOL 500 MG PO TABS: 500.0000 mg | ORAL_TABLET | Freq: Four times a day (QID) | ORAL | Status: DC

## 2015-02-08 MED ORDER — HYDROCODONE-ACETAMINOPHEN 5-325 MG PO TABS: ORAL_TABLET | ORAL | Status: DC

## 2015-02-08 NOTE — ED Notes (Signed)
Patient reports pruritus. Denies rash.

## 2015-02-08 NOTE — ED Notes (Addendum)
R Arm pain, radiates to neck, shoulder, and jaw since Monday morning when she woke up. Worse with movement (pt is guarding it). Says hurts when bus goes over bumps. Is having trouble sleeping at night bc cannot get comfortable.

## 2015-02-08 NOTE — ED Notes (Signed)
PA at the bedside.

## 2015-02-08 NOTE — ED Notes (Signed)
PA Student at the bedside.  

## 2015-02-08 NOTE — ED Notes (Signed)
Patient transported to x-ray. ?

## 2015-02-08 NOTE — Progress Notes (Signed)
ED CM [facility] pharmac[facility] stating that robaxin which is non-formulary  under medicare will switch to Zanaflex which is covered by Medicare, as per Pharmacist will switch.

## 2015-02-08 NOTE — Discharge Instructions (Signed)
Please read and follow all provided instructions.  Your diagnoses today include:  1. Musculoskeletal pain    Tests performed today include:  An x-ray of the affected area - does NOT show any broken bones  Vital signs. See below for your results today.   Medications prescribed:   Vicodin (hydrocodone/acetaminophen) - narcotic pain medication  DO NOT drive or perform any activities that require you to be awake and alert because this medicine can make you drowsy. BE VERY CAREFUL not to take multiple medicines containing Tylenol (also called acetaminophen). Doing so can lead to an overdose which can damage your liver and cause liver failure and possibly death.   Robaxin (methocarbamol) - muscle relaxer medication  DO NOT drive or perform any activities that require you to be awake and alert because this medicine can make you drowsy.   Take any prescribed medications only as directed.  Home care instructions:   Follow any educational materials contained in this packet  Follow R.I.C.E. Protocol:  R - rest your injury   I  - use ice on injury without applying directly to skin  C - compress injury with bandage or splint  E - elevate the injury as much as possible  Follow-up instructions: Please follow-up with your primary care provider if you continue to have significant pain in 1 week. In this case you may have a more severe injury that requires further care.   Return instructions:   Please return if your fingers are numb or tingling, appear gray or blue, or you have severe pain (also elevate the arm and loosen splint or wrap if you were given one)  Please return to the Emergency Department if you experience worsening symptoms.   Please return if you have any other emergent concerns.  Additional Information:  Your vital signs today were: BP 150/78 mmHg   Pulse 65   Temp(Src) 97.9 F (36.6 C) (Oral)   Resp 18   SpO2 98% If your blood pressure (BP) was elevated above  135/85 this visit, please have this repeated by your doctor within one month. --------------

## 2015-02-08 NOTE — ED Provider Notes (Signed)
CSN: [id number]     Arrival date & time 02/08/15  0945 History   First MD Initiated Contact with Patient 02/08/15 [phone]     Chief Complaint  Patient presents with  . Arm Pain     (Consider location/radiation/quality/duration/timing/severity/associated sxs/prior Treatment) HPI Comments: Patient presents with complaint of right posterior shoulder pain for the past 2 days. No initial injury. Pain radiates into her neck and jaw. It is readily reproducible with palpation and movement of the right arm. She has had trouble sleeping because she cannot find a comfortable position. She denies chest pain or shortness of breath. No nausea or vomiting. She denies abdominal pain. Patient took Tylenol at home without relief. No history of cervical radiculopathy. Patient does not have pain that radiates into her arm or numbness/tingling in her right arm or hand. Onset of symptoms acute. Course is constant. Pain is worse with palpation, movement, and certain positions. Nothing makes it better.  The history is provided by the patient and medical records.    Past Medical History  Diagnosis Date  . Internal hemorrhoids   . Diverticulosis   . Type II or unspecified type diabetes mellitus without mention of complication, not stated as uncontrolled   . Essential hypertension, benign   . Esophageal reflux   . Hyperlipidemia   . Depression   . Schizophrenia   . Obesity   . Sleep apnea   . Osteoarthritis   . CHF (congestive heart failure)    Past Surgical History  Procedure Laterality Date  . Abdominal hysterectomy    . Cataract extraction, bilateral    . Laparoscopy    . Carpal tunnel release      right  . Wrist surgery      cyst removed   Family History  Problem Relation Age of Onset  . Heart disease Mother   . Diabetes Mother   . Diabetes Sister   . Alcohol abuse Brother   . Heart disease Father     CHF  . Diabetes Father   . Hypertension Father   . Nephrolithiasis Mother   .  Nephrolithiasis Sister    History  Substance Use Topics  . Smoking status: Former Smoker -- 0.25 packs/day    Types: Cigarettes    Quit date: 06/16/2014  . Smokeless tobacco: Never Used     Comment: slowly weaning self down  . Alcohol Use: No   OB History    No data available     Review of Systems  Constitutional: Positive for activity change. Negative for fever and diaphoresis.  Eyes: Negative for redness.  Respiratory: Negative for cough and shortness of breath.   Cardiovascular: Negative for chest pain, palpitations and leg swelling.  Gastrointestinal: Negative for nausea, vomiting and abdominal pain.  Genitourinary: Negative for dysuria.  Musculoskeletal: Positive for arthralgias. Negative for back pain, joint swelling and neck pain.  Skin: Negative for rash and wound.  Neurological: Negative for syncope, weakness, light-headedness and numbness.  Psychiatric/Behavioral: The patient is not nervous/anxious.     Allergies  Aspirin; Egg yolk; and Ibuprofen  Home Medications   Prior to Admission medications   Medication Sig Start Date End Date Taking? Authorizing Provider  amLODipine (NORVASC) 10 MG tablet Take 1 tablet (10 mg total) by mouth daily. 05/04/14   Pincus Large, DO  buPROPion (WELLBUTRIN XL) 300 MG 24 hr tablet Take 300 mg by mouth daily. Per mental health     Historical Provider, MD  gabapentin (NEURONTIN) 400 MG capsule Take  400 mg by mouth at bedtime. Per mental health     Historical Provider, MD  lamoTRIgine (LAMICTAL) 200 MG tablet Take 200 mg by mouth daily. Per mental health     Historical Provider, MD  LORazepam (ATIVAN) 1 MG tablet Take 1 mg by mouth at bedtime. Prescribed by mental health     Historical Provider, MD  nicotine (NICODERM CQ - DOSED IN MG/24 HR) 7 mg/24hr patch Place 1 patch (7 mg total) onto the skin daily. Patient not taking: Reported on 08/11/2014 05/04/14   Pincus Large, DO  QUEtiapine (SEROQUEL XR) 300 MG 24 hr tablet Take 150 mg by  mouth at bedtime. Per mental health     Historical Provider, MD  simvastatin (ZOCOR) 20 MG tablet Take 1 tablet (20 mg total) by mouth at bedtime. Patient not taking: Reported on 08/11/2014 05/04/14   Pincus Large, DO   BP 158/98 mmHg  Pulse 71  Temp(Src) 97.9 F (36.6 C) (Oral)  Resp 18  SpO2 100%   Physical Exam  Constitutional: She appears well-developed and well-nourished.  HENT:  Head: Normocephalic and atraumatic.  Eyes: Conjunctivae are normal. Pupils are equal, round, and reactive to light. Right eye exhibits no discharge. Left eye exhibits no discharge.  Neck: Normal range of motion. Neck supple.  Cardiovascular: Normal rate, regular rhythm and normal heart sounds.  Exam reveals no decreased pulses.   Patient's bilateral radial pulses are difficult to find, however patient has brisk capillary refill of both hands. Ulnar pulse is easy to find. I do not suspect arterial insufficiency of the upper extremities.  Pulmonary/Chest: Effort normal and breath sounds normal.  Abdominal: Soft. There is no tenderness.  Musculoskeletal: She exhibits tenderness. She exhibits no edema.       Right shoulder: She exhibits decreased range of motion, tenderness, bony tenderness, pain and spasm. She exhibits no swelling and no deformity.       Left shoulder: Normal.       Right elbow: Normal.      Right wrist: Normal.       Cervical back: Normal.       Right upper arm: Normal.       Right forearm: Normal.       Arms:      Right hand: Normal. Normal sensation noted. Normal strength noted.  Patient withdrawals and yells in pain with light palpation over a specific area of the right scapula.  Neurological: She is alert. No sensory deficit.  Motor, sensation, and vascular distal to the injury is fully intact.   Skin: Skin is warm and dry.  Psychiatric: She has a normal mood and affect.  Nursing note and vitals reviewed.   ED Course  Procedures (including critical care time) Labs  Review Labs Reviewed - No data to display  Imaging Review Dg Scapula Right  02/08/2015   CLINICAL DATA:  Right arm pain with movement.  EXAM: RIGHT SCAPULA - 2+ VIEWS  COMPARISON:  None.  FINDINGS: There is no evidence of fracture or other focal bone lesions. Soft tissues are unremarkable.  IMPRESSION: No acute osseous injury of the right scapula.   Electronically Signed   By: Elige Ko   On: 02/08/2015 10:41   Dg Shoulder Right  02/08/2015   CLINICAL DATA:  R Arm pain, radiates to neck, shoulder, and jaw since Monday morning when she woke up. Worse with movement (pt is guarding it). Says hurts when bus goes over bumps. Is having trouble sleeping at night  bc cannot get comfortable  EXAM: RIGHT SHOULDER - 2+ VIEW  COMPARISON:  None.  FINDINGS: There is no evidence of fracture or dislocation. There is no evidence of arthropathy or other focal bone abnormality. Soft tissues are unremarkable.  IMPRESSION: Negative.   Electronically Signed   By: Corlis Leak  Hassell M.D.   On: 02/08/2015 10:41     EKG Interpretation None       10:09 AM Patient seen and examined. Work-up initiated. Medications ordered.   Vital signs reviewed and are as follows: BP 158/98 mmHg  Pulse 71  Temp(Src) 97.9 F (36.6 C) (Oral)  Resp 18  SpO2 100%  X-ray performed and was negative. Pt had some itching from Percocet so Benadryl was given. Patient informed of results. Discussed rice protocol. Discussed PCP follow-up 1 week if not feeling better. Will treat in interim with robaxin and vicodin.   Patient counseled on proper use of muscle relaxant medication.  They were told not to drink alcohol, drive any vehicle, or do any dangerous activities while taking this medication.  Patient verbalized understanding.  Patient counseled on use of narcotic pain medications. Counseled not to combine these medications with others containing tylenol. Urged not to drink alcohol, drive, or perform any other activities that requires focus while  taking these medications. The patient verbalizes understanding and agrees with the plan.   MDM   Final diagnoses:  Musculoskeletal pain   Patient with reproducible MSK type pain with negative x-rays. Do not suspect referred pain from abdomen given no abd pain or GI symptoms. Do not suspect radicular pain given no radiation down arm. Do not suspect ACS given lack of CP, reproducible sx.   No dangerous or life-threatening conditions suspected or identified by history, physical exam, and by work-up. No indications for hospitalization identified.      Renne CriglerJoshua Eulene Pekar, PA-C 02/08/15 1449  Rolland PorterMark James, MD 02/09/15 1007

## 2015-02-08 NOTE — ED Notes (Signed)
Patient transported to X-ray 

## 2015-05-17 ENCOUNTER — Ambulatory Visit (INDEPENDENT_AMBULATORY_CARE_PROVIDER_SITE_OTHER): Payer: Medicare Other | Admitting: Obstetrics and Gynecology

## 2015-05-17 ENCOUNTER — Encounter: Payer: Self-pay | Admitting: Obstetrics and Gynecology

## 2015-05-17 VITALS — BP 146/86 | HR 79 | Temp 98.1°F | Ht 63.0 in | Wt 214.5 lb

## 2015-05-17 DIAGNOSIS — E669 Obesity, unspecified: Secondary | ICD-10-CM | POA: Diagnosis not present

## 2015-05-17 DIAGNOSIS — R7303 Prediabetes: Secondary | ICD-10-CM | POA: Diagnosis not present

## 2015-05-17 DIAGNOSIS — E785 Hyperlipidemia, unspecified: Secondary | ICD-10-CM | POA: Diagnosis not present

## 2015-05-17 DIAGNOSIS — Z114 Encounter for screening for human immunodeficiency virus [HIV]: Secondary | ICD-10-CM

## 2015-05-17 DIAGNOSIS — Z1159 Encounter for screening for other viral diseases: Secondary | ICD-10-CM | POA: Diagnosis not present

## 2015-05-17 DIAGNOSIS — I10 Essential (primary) hypertension: Secondary | ICD-10-CM | POA: Diagnosis not present

## 2015-05-17 DIAGNOSIS — Z0000 Encounter for general adult medical examination without abnormal findings: Secondary | ICD-10-CM | POA: Diagnosis not present

## 2015-05-17 DIAGNOSIS — Z202 Contact with and (suspected) exposure to infections with a predominantly sexual mode of transmission: Secondary | ICD-10-CM

## 2015-05-17 DIAGNOSIS — Z23 Encounter for immunization: Secondary | ICD-10-CM

## 2015-05-17 DIAGNOSIS — R739 Hyperglycemia, unspecified: Secondary | ICD-10-CM | POA: Diagnosis not present

## 2015-05-17 LAB — COMPREHENSIVE METABOLIC PANEL
ALT: 8 U/L (ref 6–29)
AST: 12 U/L (ref 10–35)
Albumin: 4 g/dL (ref 3.6–5.1)
Alkaline Phosphatase: 101 U/L (ref 33–130)
BUN: 6 mg/dL — AB (ref 7–25)
CALCIUM: 9.5 mg/dL (ref 8.6–10.4)
CHLORIDE: 104 mmol/L (ref 98–110)
CO2: 27 mmol/L (ref 20–31)
Creat: 0.54 mg/dL (ref 0.50–0.99)
Glucose, Bld: 115 mg/dL — ABNORMAL HIGH (ref 65–99)
POTASSIUM: 3.7 mmol/L (ref 3.5–5.3)
Sodium: 140 mmol/L (ref 135–146)
Total Bilirubin: 0.3 mg/dL (ref 0.2–1.2)
Total Protein: 7.4 g/dL (ref 6.1–8.1)

## 2015-05-17 LAB — LIPID PANEL
CHOL/HDL RATIO: 3.8 ratio (ref <=5.0)
Cholesterol: 169 mg/dL (ref 125–200)
HDL: 44 mg/dL — AB (ref >=46)
LDL CALC: 109 mg/dL (ref <130)
Triglycerides: 82 mg/dL (ref <150)
VLDL: 16 mg/dL (ref <30)

## 2015-05-17 LAB — POCT GLYCOSYLATED HEMOGLOBIN (HGB A1C): Hemoglobin A1C: 6

## 2015-05-17 MED ORDER — ZOSTER VACCINE LIVE 19400 UNT/0.65ML ~~LOC~~ SOLR: 0.6500 mL | Freq: Once | SUBCUTANEOUS | Status: DC

## 2015-05-17 NOTE — Patient Instructions (Signed)

## 2015-05-17 NOTE — Progress Notes (Signed)
    Subjective: Chief Complaint  Patient presents with  . Annual Exam    HPI: Alicia [name] is a 64 y.o. female presents for well woman/preventative visit.  Acute Concerns: None  Diet: Well-balanced. Believes however that her sugars may be going up.  Exercise: yes regularly; "all kinds"  Sexual History: Not currently sexually active  POA/Living Will: None in place but would like information  Social:  Social History   Social History  . Marital Status: Widowed    Spouse Name: N/A  . Number of Children: 6  . Years of Education: N/A   Occupational History  . disabled    Social History Main Topics  . Smoking status: Former Smoker -- 0.25 packs/day    Types: Cigarettes    Quit date: 06/16/2014  . Smokeless tobacco: Never Used     Comment: slowly weaning self down  . Alcohol Use: No  . Drug Use: No  . Sexual Activity: Not Asked   Other Topics Concern  . None   Social History Narrative    Immunization:  Tdap/TD: Due  Influenza: Due  Herpes Zoster: Due  Cancer Screening:  Pap Smear: N/A, hysterectomy  Mammogram: Up to Date  Colonoscopy: Due  Dexa:    ROS +cough, muscle cramps/aches, trouble seeing ROS reviewed and were negative unless otherwise noted above.  Past Medical, Surgical, Social, and Family History Reviewed & Updated per EMR.   Objective: BP 146/86 mmHg  Pulse 79  Temp(Src) 98.1 F (36.7 C) (Oral)  Ht 5\' 3"  (1.6 m)  Wt 214 lb 8 oz (97.297 kg)  BMI 38.01 kg/m2  Physical Exam  Constitutional: She is oriented to person, place, and time and well-developed, well-nourished, and in no distress.  HENT:  Head: Normocephalic and atraumatic.  Mouth/Throat: Oropharynx is clear and moist.  Eyes: EOM are normal. Pupils are equal, round, and reactive to light.  Neck: Normal range of motion. Neck supple. No thyromegaly present.  Cardiovascular: Normal rate, regular rhythm, normal heart sounds and intact distal pulses.   Pulmonary/Chest: Effort  normal and breath sounds normal.  Abdominal: Soft. Bowel sounds are normal. There is no tenderness.  Musculoskeletal: Normal range of motion. She exhibits no edema.  Lymphadenopathy:    She has no cervical adenopathy.  Neurological: She is alert and oriented to person, place, and time.  Grossly non-focal  Skin: Skin is warm and dry.  Psychiatric: Mood and affect normal.    Depression screen PHQ 2/9 05/17/2015  Decreased Interest 0  Down, Depressed, Hopeless 0  PHQ - 2 Score 0    Assessment/Plan: Please see problem based Assessment and Plan   Orders Placed This Encounter  Procedures  . Tdap vaccine greater than or equal to 7yo IM  . Flu Vaccine QUAD 36+ mos IM  . Comprehensive metabolic panel  . Lipid panel  . Hepatitis C antibody screen  . HIV antibody  . POCT glycosylated hemoglobin (Hb A1C)    Meds ordered this encounter  Medications  . zoster vaccine live, PF, (ZOSTAVAX) [id number] UNT/0.65ML injection    Sig: Inject 19,400 Units into the skin once.    Dispense:  1 each    Refill:  0     Caryl AdaJazma Phelps, DO 05/18/2015, 6:27 PM PGY-2, [facility]

## 2015-05-18 ENCOUNTER — Encounter: Payer: Self-pay | Admitting: Obstetrics and Gynecology

## 2015-05-18 LAB — HEPATITIS C ANTIBODY: HCV Ab: NEGATIVE

## 2015-05-18 LAB — HIV ANTIBODY (ROUTINE TESTING W REFLEX): HIV: NONREACTIVE

## 2015-05-19 ENCOUNTER — Other Ambulatory Visit: Payer: Self-pay | Admitting: Obstetrics and Gynecology

## 2015-05-19 DIAGNOSIS — E669 Obesity, unspecified: Secondary | ICD-10-CM | POA: Diagnosis not present

## 2015-05-19 DIAGNOSIS — Z23 Encounter for immunization: Secondary | ICD-10-CM | POA: Diagnosis not present

## 2015-05-19 DIAGNOSIS — R7303 Prediabetes: Secondary | ICD-10-CM | POA: Diagnosis not present

## 2015-05-19 DIAGNOSIS — R739 Hyperglycemia, unspecified: Secondary | ICD-10-CM | POA: Diagnosis not present

## 2015-05-19 DIAGNOSIS — I10 Essential (primary) hypertension: Secondary | ICD-10-CM | POA: Diagnosis not present

## 2015-05-19 DIAGNOSIS — Z0000 Encounter for general adult medical examination without abnormal findings: Secondary | ICD-10-CM | POA: Diagnosis not present

## 2015-05-19 DIAGNOSIS — Z202 Contact with and (suspected) exposure to infections with a predominantly sexual mode of transmission: Secondary | ICD-10-CM | POA: Diagnosis not present

## 2015-05-19 DIAGNOSIS — Z1159 Encounter for screening for other viral diseases: Secondary | ICD-10-CM | POA: Diagnosis not present

## 2015-05-19 DIAGNOSIS — Z114 Encounter for screening for human immunodeficiency virus [HIV]: Secondary | ICD-10-CM | POA: Diagnosis not present

## 2015-05-19 DIAGNOSIS — E785 Hyperlipidemia, unspecified: Secondary | ICD-10-CM | POA: Diagnosis not present

## 2015-05-19 MED ORDER — NICOTINE 7 MG/24HR TD PT24: 7.0000 mg | MEDICATED_PATCH | Freq: Every day | TRANSDERMAL | Status: DC

## 2015-05-19 NOTE — Assessment & Plan Note (Signed)
Will recheck lipid panel patient has declined statin therapy in past.

## 2015-05-19 NOTE — Assessment & Plan Note (Signed)
A: BP today within normal range for her age based off JNC 8 guidelines of <150/90. Patient was discontinued off BP medication in ED on 01/2015. No reason for this discontinuation.  P: Patient to monitor BP at home and if elevated call back to restart medications. Discussed risk of elevated BPs.

## 2015-05-19 NOTE — Assessment & Plan Note (Signed)
Comes in today for routine health physical. Doing well. Received flu and Tdap vaccine today. Zostavax ordered to her pharmacy. Routine blood work also collected including BMP, A1c, lipid panel, Hep C, and HIV. Information given to patient about colonoscopy as she is past due. She also received information about living will.

## 2015-06-19 ENCOUNTER — Telehealth: Payer: Self-pay | Admitting: Obstetrics and Gynecology

## 2015-06-19 NOTE — Telephone Encounter (Signed)
Letter written and sent to admin pool to send out to patient. Thanks

## 2015-06-19 NOTE — Telephone Encounter (Signed)
Patient left message on medical records line that she needs a letter written by the doctor stating that she does not have congestive heart failure.  She needs this for her life insurance.

## 2015-06-19 NOTE — Telephone Encounter (Signed)
Will forward to PCP for review of writing letter. Mekaylah Klich, CMA. 

## 2015-06-20 ENCOUNTER — Telehealth: Payer: Self-pay | Admitting: Obstetrics and Gynecology

## 2015-06-20 NOTE — Telephone Encounter (Signed)
Will forward to MD. Jolanta Cabeza,CMA  

## 2015-06-20 NOTE — Telephone Encounter (Signed)
Please see phone note from yesterday. Letter made and sent. Thanks.

## 2015-06-20 NOTE — Telephone Encounter (Signed)
Pt called and needs a letter stating that she doesn't have Congested Heart Failure. She needs this letter ASAP for her life insurance. jw

## 2015-06-21 NOTE — Telephone Encounter (Signed)
Spoke with patient and informed her that I would place a copy of letter up front for her to pick up.  She voiced understanding. Alicia [name],CMA

## 2015-06-30 DIAGNOSIS — H401131 Primary open-angle glaucoma, bilateral, mild stage: Secondary | ICD-10-CM | POA: Diagnosis not present

## 2015-07-07 DIAGNOSIS — H401131 Primary open-angle glaucoma, bilateral, mild stage: Secondary | ICD-10-CM | POA: Diagnosis not present

## 2016-03-04 DIAGNOSIS — E119 Type 2 diabetes mellitus without complications: Secondary | ICD-10-CM | POA: Diagnosis not present

## 2016-03-04 DIAGNOSIS — H401131 Primary open-angle glaucoma, bilateral, mild stage: Secondary | ICD-10-CM | POA: Diagnosis not present

## 2016-05-17 ENCOUNTER — Encounter: Payer: Self-pay | Admitting: Obstetrics and Gynecology

## 2016-05-17 ENCOUNTER — Ambulatory Visit (INDEPENDENT_AMBULATORY_CARE_PROVIDER_SITE_OTHER): Payer: Medicare Other | Admitting: Obstetrics and Gynecology

## 2016-05-17 VITALS — BP 160/86 | HR 68 | Temp 97.8°F | Wt 216.0 lb

## 2016-05-17 DIAGNOSIS — Z23 Encounter for immunization: Secondary | ICD-10-CM | POA: Diagnosis not present

## 2016-05-17 DIAGNOSIS — F329 Major depressive disorder, single episode, unspecified: Secondary | ICD-10-CM

## 2016-05-17 DIAGNOSIS — Z0000 Encounter for general adult medical examination without abnormal findings: Secondary | ICD-10-CM

## 2016-05-17 DIAGNOSIS — E785 Hyperlipidemia, unspecified: Secondary | ICD-10-CM

## 2016-05-17 DIAGNOSIS — E119 Type 2 diabetes mellitus without complications: Secondary | ICD-10-CM | POA: Diagnosis not present

## 2016-05-17 DIAGNOSIS — I10 Essential (primary) hypertension: Secondary | ICD-10-CM | POA: Diagnosis not present

## 2016-05-17 DIAGNOSIS — M170 Bilateral primary osteoarthritis of knee: Secondary | ICD-10-CM | POA: Diagnosis not present

## 2016-05-17 DIAGNOSIS — F17200 Nicotine dependence, unspecified, uncomplicated: Secondary | ICD-10-CM

## 2016-05-17 DIAGNOSIS — F32A Depression, unspecified: Secondary | ICD-10-CM

## 2016-05-17 LAB — POCT GLYCOSYLATED HEMOGLOBIN (HGB A1C): HEMOGLOBIN A1C: 6

## 2016-05-17 MED ORDER — HYDROCHLOROTHIAZIDE 12.5 MG PO TABS: 12.5000 mg | ORAL_TABLET | Freq: Every day | ORAL | 3 refills | Status: DC

## 2016-05-17 NOTE — Progress Notes (Signed)
Subjective: Chief Complaint  Patient presents with  . Annual Exam    HPI: Alicia [name] is a 65 y.o. female presents for well woman/preventative visit.  Acute Concerns: None  #HTN. Does not monitor at home. Not on medications. Discontinued last year. Has been having borderline BPs.   #Tobacco Use. Started back smoking again around May. One pack last 3 days  #Depression. States she feels well. No SI/HI. No longer going to [facility] Not on medications. States she has good and bad days.   #Dyslipidiemia. Not on medication  #OA. Continues to endorse joint pains but states she trued to stay active to help with pan. She also uses hot/cold patches with some symptom relief.   Diet: Well-balanced.   Exercise: yes regularly; "all kinds"  Sexual History: Not currently sexually active  POA/Living Will: No  Social:  Social History   Social History  . Marital status: Widowed    Spouse name: N/A  . Number of children: 6  . Years of education: N/A   Occupational History  . disabled    Social History Main Topics  . Smoking status: Current Every Day Smoker    Packs/day: 0.25    Types: Cigarettes  . Smokeless tobacco: Never Used     Comment: slowly weaning self down  . Alcohol use No  . Drug use: No  . Sexual activity: Not Asked   Other Topics Concern  . None   Social History Narrative  . None    Immunization:  Tdap/TD: Due  Influenza: Due  Herpes Zoster: Up to date   Cancer Screening:  Pap Smear: N/A, hysterectomy  Mammogram: Due  Colonoscopy: Due  Dexa: Due   ROS reviewed and were negative unless otherwise noted above.  Past Medical, Surgical, Social, and Family History Reviewed & Updated per EMR.   Objective: BP (!) 160/86   Pulse 68   Temp 97.8 F (36.6 C) (Oral)   Wt 216 lb (98 kg)   SpO2 98%   BMI 38.26 kg/m   Physical Exam  Constitutional: She is oriented to person, place, and time and well-developed, well-nourished, and in no distress.   HENT:  Head: Normocephalic and atraumatic.  Mouth/Throat: Oropharynx is clear and moist.  Eyes: EOM are normal. Pupils are equal, round, and reactive to light.  Neck: Normal range of motion. Neck supple. No thyromegaly present.  Cardiovascular: Normal rate, regular rhythm, normal heart sounds and intact distal pulses.   Pulmonary/Chest: Effort normal and breath sounds normal.  Abdominal: Soft. Bowel sounds are normal. There is no tenderness.  Musculoskeletal: Normal range of motion. She exhibits no edema.  Lymphadenopathy:    She has no cervical adenopathy.  Neurological: She is alert and oriented to person, place, and time.  Grossly non-focal  Skin: Skin is warm and dry.  Psychiatric: Mood and affect normal.   Diabetic Foot Exam - Simple   Simple Foot Form Diabetic Foot exam was performed with the following findings:  Yes 05/21/2016  8:00 AM  Visual Inspection No deformities, no ulcerations, no other skin breakdown bilaterally:  Yes Sensation Testing Intact to touch and monofilament testing bilaterally:  Yes Pulse Check Posterior Tibialis and Dorsalis pulse intact bilaterally:  Yes Comments     Depression screen PHQ 2/9 05/17/2016  Decreased Interest 0  Down, Depressed, Hopeless 0  PHQ - 2 Score 0     Assessment/Plan: Please see problem based Assessment and Plan   Orders Placed This Encounter  Procedures  . Flu  Vaccine QUAD 36+ mos IM  . Microalbumin, urine  . Lipid panel  . POCT glycosylated hemoglobin (Hb A1C)    Meds ordered this encounter  Medications  . hydrochlorothiazide (HYDRODIURIL) 12.5 MG tablet    Sig: Take 1 tablet (12.5 mg total) by mouth daily.    Dispense:  30 tablet    Refill:  3     Caryl AdaJazma Keeleigh Terris, DO 05/17/2016, 9:39 AM PGY-2, [facility]

## 2016-05-17 NOTE — Patient Instructions (Signed)
Doing well today  -Please schedule to get your mammogram done -Will contact you about blood work -starting you on a low dose blood pressure medication Continue to work on diet and exercise  Follow-up with me in 6 months or sooner as needed

## 2016-05-18 LAB — LIPID PANEL
Cholesterol: 176 mg/dL (ref 125–200)
HDL: 50 mg/dL (ref >=46)
LDL CALC: 100 mg/dL (ref <130)
TRIGLYCERIDES: 131 mg/dL (ref <150)
Total CHOL/HDL Ratio: 3.5 Ratio (ref <=5.0)
VLDL: 26 mg/dL (ref <30)

## 2016-05-18 LAB — MICROALBUMIN, URINE: Microalb, Ur: 1.4 mg/dL

## 2016-05-20 ENCOUNTER — Encounter: Payer: Self-pay | Admitting: Obstetrics and Gynecology

## 2016-05-21 ENCOUNTER — Encounter: Payer: Self-pay | Admitting: Obstetrics and Gynecology

## 2016-05-21 NOTE — Assessment & Plan Note (Signed)
Flu vaccine given today. Patient to schedule her mammogram.

## 2016-05-21 NOTE — Assessment & Plan Note (Signed)
Stable, controlled. Screening questions today negative. Not on therapy. No longer going to [facility]. Discussed resources we have in our clinic for patient. Will hold off on starting any medications at this time.

## 2016-05-21 NOTE — Assessment & Plan Note (Signed)
Restarted smoking again. Encouraged tobacco cessation.

## 2016-05-21 NOTE — Assessment & Plan Note (Signed)
No acute findings. Chronic long-standing OA in joints (mainly knees). Continue conservative measures.

## 2016-05-21 NOTE — Assessment & Plan Note (Signed)
Continues to be borderline. Elevated x2 today. Will restart patient on medication. Rx for HCTZ 12.5mg  daily. Goal BP <150/90. Follow-up for nurse visit in a week to check BP.

## 2016-05-21 NOTE — Assessment & Plan Note (Signed)
Has been well-controlled with diet and exercise. Not on medications at this time. A1c today 6.0. Will continue to monitor. Collect urine micro-albumin today. Diabetic foot exam performed and was normal. Recheck A1c in 6 months.

## 2016-05-21 NOTE — Assessment & Plan Note (Addendum)
H/o dyslipidemia with a total cholesterol >200 back in 2013. Since has been well controlled. Will recheck lipid profile today. Declined statin in the past. ASCVD risk is 45.3% at [age]yrs. Encouraged continued good diet and exercise regimens. Tobacco cessation counseling given. Unable to take ASA due to allergy.

## 2016-07-04 DIAGNOSIS — E119 Type 2 diabetes mellitus without complications: Secondary | ICD-10-CM | POA: Diagnosis not present

## 2016-07-04 DIAGNOSIS — H401131 Primary open-angle glaucoma, bilateral, mild stage: Secondary | ICD-10-CM | POA: Diagnosis not present

## 2017-01-02 DIAGNOSIS — H401131 Primary open-angle glaucoma, bilateral, mild stage: Secondary | ICD-10-CM | POA: Diagnosis not present

## 2017-01-09 ENCOUNTER — Ambulatory Visit: Payer: Medicare Other | Admitting: *Deleted

## 2017-02-07 DIAGNOSIS — H401131 Primary open-angle glaucoma, bilateral, mild stage: Secondary | ICD-10-CM | POA: Diagnosis not present

## 2017-02-19 ENCOUNTER — Telehealth: Payer: Self-pay | Admitting: Family Medicine

## 2017-02-19 NOTE — Telephone Encounter (Signed)
Pt needs handicapped placard renewed.  Pt would like dr to complete the form needed. Please let pt know when ready to pickup

## 2017-02-20 NOTE — Telephone Encounter (Signed)
Contacted pt to discuss parking sticker, pt stated her phone was running out of battery and she will call me this afternoon. Name and call back information given to patient.

## 2017-02-21 NOTE — Telephone Encounter (Signed)
Formed filled out and placed in providers box for review.

## 2017-02-25 NOTE — Telephone Encounter (Signed)
Unclear why Alicia [name] needs placard from chart review. Would need to speak with her on the phone or see her in clinic, I've called but was unable to reach her. Please inform her if you are able to reach her. Thank you.

## 2017-02-25 NOTE — Telephone Encounter (Signed)
Pt contacted and informed of need of apt for handicap card. Pt agreed and apt has been made for 8/9 with pcp.

## 2017-02-27 ENCOUNTER — Encounter: Payer: Self-pay | Admitting: Family Medicine

## 2017-02-27 ENCOUNTER — Ambulatory Visit (INDEPENDENT_AMBULATORY_CARE_PROVIDER_SITE_OTHER): Payer: Medicare Other | Admitting: Family Medicine

## 2017-02-27 VITALS — BP 148/86 | HR 62 | Temp 98.3°F | Ht 63.0 in | Wt 206.4 lb

## 2017-02-27 DIAGNOSIS — M170 Bilateral primary osteoarthritis of knee: Secondary | ICD-10-CM | POA: Diagnosis not present

## 2017-02-27 NOTE — Assessment & Plan Note (Signed)
  Bilateral OA of knees. Has seen surgeon for this but decided not to proceed with it. Pain managed by massage and exercises/stretching. .  -encourage weight loss -steroid injections as needed

## 2017-02-27 NOTE — Progress Notes (Signed)
Subjective:    Patient ID: Alicia [name], female    DOB: [birth year], 66 y.o.   MRN: [id number]   CC: wants to renew handicap placard  Bilateral knee pain -has OA bilaterally -has seen ortho about this- they recommended weight loss prior to TKR -decided to not pursue surgery -pain managed with massage, knee stretches -occasionally uses cane to ambulate due to pain -tries to avoid taking medications -no SOB with walking  Smoking status reviewed- current every day smoker  Review of Systems- see HPI   Objective:  BP (!) 148/86   Pulse 62   Temp 98.3 F (36.8 C) (Oral)   Ht 5\' 3"  (1.6 m)   Wt 206 lb 6.4 oz (93.6 kg)   SpO2 99%   BMI 36.56 kg/m  Vitals and nursing note reviewed  General: well nourished, in no acute distress Cardiac: RRR, clear S1 and S2, no murmurs, rubs, or gallops Respiratory: clear to auscultation bilaterally, no increased work of breathing Extremities: no edema or cyanosis.  Knees: Full ROM at knees bilaterally with patellar crepitus bilaterally. Non-tender to palpation. No deformity. Neuro: alert and oriented, no focal deficits   Assessment & Plan:    Osteoarthritis  Bilateral OA of knees. Has seen surgeon for this but decided not to proceed with it. Pain managed by massage and exercises/stretching. .  -encourage weight loss -steroid injections as needed    Return in about 2 months (around 04/29/2017) for annual physical.   Dolores Patty, DO Family Medicine Resident PGY-2

## 2017-03-04 ENCOUNTER — Telehealth: Payer: Self-pay | Admitting: *Deleted

## 2017-03-04 NOTE — Telephone Encounter (Signed)
Patient called stating someone from [facility] called her about 1:30 today, but did not leave a message.  Advised patient that there was no note from a clinic staff or provider calling her. It could have been regarding annual wellness visit. Pt stated she received her annual wellness visit about 2 months ago with an [facility].  This is the second year that they completed her wellness visit.  Will forward to PCP.  Clovis PuMartin, Shatora Weatherbee L, RN

## 2017-03-05 DIAGNOSIS — H401131 Primary open-angle glaucoma, bilateral, mild stage: Secondary | ICD-10-CM | POA: Diagnosis not present

## 2017-03-05 NOTE — Telephone Encounter (Signed)
Unsure why she was called but thank you for the heads up.

## 2017-04-09 ENCOUNTER — Encounter (HOSPITAL_COMMUNITY): Payer: Self-pay

## 2017-04-09 ENCOUNTER — Emergency Department (HOSPITAL_COMMUNITY)
Admission: EM | Admit: 2017-04-09 | Discharge: 2017-04-09 | Disposition: A | Discharge status: Home / Self Care | Payer: Medicare Other | Attending: Emergency Medicine | Admitting: Emergency Medicine

## 2017-04-09 ENCOUNTER — Emergency Department (HOSPITAL_COMMUNITY): Payer: Medicare Other

## 2017-04-09 DIAGNOSIS — E119 Type 2 diabetes mellitus without complications: Secondary | ICD-10-CM | POA: Diagnosis not present

## 2017-04-09 DIAGNOSIS — F17210 Nicotine dependence, cigarettes, uncomplicated: Secondary | ICD-10-CM | POA: Diagnosis not present

## 2017-04-09 DIAGNOSIS — R0789 Other chest pain: Secondary | ICD-10-CM | POA: Insufficient documentation

## 2017-04-09 DIAGNOSIS — I509 Heart failure, unspecified: Secondary | ICD-10-CM | POA: Diagnosis not present

## 2017-04-09 DIAGNOSIS — E785 Hyperlipidemia, unspecified: Secondary | ICD-10-CM | POA: Diagnosis not present

## 2017-04-09 DIAGNOSIS — Z79899 Other long term (current) drug therapy: Secondary | ICD-10-CM | POA: Insufficient documentation

## 2017-04-09 DIAGNOSIS — I110 Hypertensive heart disease with heart failure: Secondary | ICD-10-CM | POA: Insufficient documentation

## 2017-04-09 DIAGNOSIS — R079 Chest pain, unspecified: Secondary | ICD-10-CM | POA: Diagnosis not present

## 2017-04-09 LAB — BASIC METABOLIC PANEL
Anion gap: 12 (ref 5–15)
BUN: 5 mg/dL — ABNORMAL LOW (ref 6–20)
CO2: 21 mmol/L — ABNORMAL LOW (ref 22–32)
Calcium: 9.3 mg/dL (ref 8.9–10.3)
Chloride: 107 mmol/L (ref 101–111)
Creatinine, Ser: 0.64 mg/dL (ref 0.44–1.00)
GFR calc Af Amer: >60 mL/min (ref >60)
GFR calc non Af Amer: >60 mL/min (ref >60)
Glucose, Bld: 93 mg/dL (ref 65–99)
POTASSIUM: 3.1 mmol/L — AB (ref 3.5–5.1)
SODIUM: 140 mmol/L (ref 135–145)

## 2017-04-09 LAB — CBC
HEMATOCRIT: 37.4 % (ref 36.0–46.0)
HEMOGLOBIN: 11.7 g/dL — AB (ref 12.0–15.0)
MCH: 25.8 pg — ABNORMAL LOW (ref 26.0–34.0)
MCHC: 31.3 g/dL (ref 30.0–36.0)
MCV: 82.6 fL (ref 78.0–100.0)
Platelets: 272 10*3/uL (ref 150–400)
RBC: 4.53 MIL/uL (ref 3.87–5.11)
RDW: 15.9 % — ABNORMAL HIGH (ref 11.5–15.5)
WBC: 7.5 10*3/uL (ref 4.0–10.5)

## 2017-04-09 LAB — I-STAT TROPONIN, ED: Troponin i, poc: 0 ng/mL (ref 0.00–0.08)

## 2017-04-09 NOTE — Discharge Instructions (Signed)

## 2017-04-09 NOTE — ED Triage Notes (Signed)
Patient complains of left anterior chest pain describes as intermittently for several days. Describes as sharp pain. Reports not taking BP meds as prescribed, NAD

## 2017-04-09 NOTE — ED Notes (Signed)
Could not find pt when trying to re-vitlize

## 2017-04-09 NOTE — ED Provider Notes (Signed)
[facility]-EMERGENCY DEPT Provider Note   CSN: [id number] Arrival date & time: 04/09/17  1005     History   Chief Complaint Chief Complaint  Patient presents with  . Chest Pain    HPI Alicia [name] is a 65 y.o. female with a pmhx of CHF, GERD, HLD, HTN, tobacco abuse, schizophrenia presented to the ED today complaining of chest pain. Patient states that she's been having constant left-sided cramping chest pain for the last 4 days.Pain is constant and worsened with palpation and laying on her left side. Pt has history of similar symptoms. She has been taking tylenol without relief. She denies any SOB, diaphoresis, syncope, paresthesias, N/V, weakness. Denies any new medications, LE swelling.  HPI  Past Medical History:  Diagnosis Date  . CHF (congestive heart failure) (HCC)   . Depression   . Diverticulosis   . Esophageal reflux   . Essential hypertension, benign   . Glaucoma   . Hyperlipidemia   . Internal hemorrhoids   . Obesity   . Osteoarthritis   . Schizophrenia (HCC)   . Sleep apnea   . Type II or unspecified type diabetes mellitus without mention of complication, not stated as uncontrolled     Patient Active Problem List   Diagnosis Date Noted  . Diabetes type 2, controlled (HCC) 05/17/2016  . Preventative health care 05/19/2015  . Renal cyst 05/05/2012  . GERD (gastroesophageal reflux disease) 10/24/2011  . TOBACCO USER 06/18/2010  . HOT FLASHES 05/29/2009  . BACK PAIN, LUMBAR 03/07/2009  . Dyslipidemia 10/06/2006  . OBESITY, NOS 09/18/2006  . SCHIZOPHRENIA 09/18/2006  . Depression, controlled 09/18/2006  . HYPERTENSION, BENIGN SYSTEMIC 09/18/2006  . COR PULMONALE 09/18/2006  . Osteoarthritis 09/18/2006  . APNEA, SLEEP 09/18/2006    Past Surgical History:  Procedure Laterality Date  . ABDOMINAL HYSTERECTOMY    . CARPAL TUNNEL RELEASE     right  . CATARACT EXTRACTION, BILATERAL    . LAPAROSCOPY    . WRIST SURGERY     cyst removed    OB History     No data available       Home Medications    Prior to Admission medications   Medication Sig Start Date End Date Taking? Authorizing Provider  acetaminophen (TYLENOL) 500 MG tablet Take 1,000 mg by mouth every 6 (six) hours as needed for mild pain.    [provider]  hydrochlorothiazide (HYDRODIURIL) 12.5 MG tablet Take 1 tablet (12.5 mg total) by mouth daily. 05/17/16   Pincus Large, DO  HYDROcodone-acetaminophen (NORCO/VICODIN) 5-325 MG per tablet Take 1-2 tablets every 6 hours as needed for severe pain 02/08/15   Renne Crigler, PA-C  methocarbamol (ROBAXIN) 500 MG tablet Take 1 tablet (500 mg total) by mouth 4 (four) times daily. 02/08/15   Renne Crigler, PA-C  nicotine (NICODERM CQ - DOSED IN MG/24 HR) 7 mg/24hr patch Place 1 patch (7 mg total) onto the skin daily. 05/19/15   Pincus Large, DO  TRAVATAN Z 0.004 % SOLN ophthalmic solution Place 1 drop into both eyes every evening. 12/26/14   [provider]  zoster vaccine live, PF, (ZOSTAVAX) [id number] UNT/0.65ML injection Inject 19,400 Units into the skin once. 05/17/15   Pincus Large, DO    Family History Family History  Problem Relation Age of Onset  . Heart disease Mother   . Diabetes Mother   . Nephrolithiasis Mother   . Diabetes Sister   . Alcohol abuse Brother   . Heart disease Father  CHF  . Diabetes Father   . Hypertension Father   . Nephrolithiasis Sister     Social History Social History  Substance Use Topics  . Smoking status: Current Every Day Smoker    Packs/day: 0.25    Types: Cigarettes  . Smokeless tobacco: Never Used     Comment: slowly weaning self down  . Alcohol use No     Allergies   Aspirin; Banana; Chocolate; Egg yolk; and Ibuprofen   Review of Systems Review of Systems  All other systems reviewed and are negative.    Physical Exam Updated Vital Signs BP (!) 168/77   Pulse 66   Temp 98.2 F (36.8 C) (Oral)   Resp 18   SpO2 100%   Physical Exam    Constitutional: She is oriented to person, place, and time. She appears well-developed and well-nourished. No distress.  HENT:  Head: Normocephalic and atraumatic.  Mouth/Throat: No oropharyngeal exudate.  Eyes: Pupils are equal, round, and reactive to light. Conjunctivae and EOM are normal. Right eye exhibits no discharge. Left eye exhibits no discharge. No scleral icterus.  Cardiovascular: Normal rate, regular rhythm, normal heart sounds and intact distal pulses.  Exam reveals no gallop and no friction rub.   No murmur heard. Pulmonary/Chest: Effort normal and breath sounds normal. No respiratory distress. She has no wheezes. She has no rales. She exhibits tenderness ( left anterior wall TTP).  Abdominal: Soft. She exhibits no distension. There is no tenderness. There is no guarding.  Musculoskeletal: Normal range of motion. She exhibits no edema.  Neurological: She is alert and oriented to person, place, and time. No cranial nerve deficit.  Strength 5/5 throughout. No sensory deficits. No gait abnormality. No dysmetria. No slurred speech. No facial droop. Negative pronator drift.    Skin: Skin is warm and dry. No rash noted. She is not diaphoretic. No erythema. No pallor.  Psychiatric: She has a normal mood and affect. Her behavior is normal.  Nursing note and vitals reviewed.    ED Treatments / Results  Labs (all labs ordered are listed, but only abnormal results are displayed) Labs Reviewed  BASIC METABOLIC PANEL - Abnormal; Notable for the following:       Result Value   Potassium 3.1 (*)    CO2 21 (*)    BUN 5 (*)    All other components within normal limits  CBC - Abnormal; Notable for the following:    Hemoglobin 11.7 (*)    MCH 25.8 (*)    RDW 15.9 (*)    All other components within normal limits  I-STAT TROPONIN, ED  I-STAT TROPONIN, ED    EKG  EKG Interpretation  Date/Time:  Wednesday April 09 2017 10:16:36 EDT Ventricular Rate:  67 PR Interval:  154 QRS  Duration: 90 QT Interval:  444 QTC Calculation: 469 R Axis:   65 Text Interpretation:  Normal sinus rhythm Normal ECG since last tracing no significant change Confirmed by Eber Hong ([id number]) on 04/09/2017 2:46:22 PM       Radiology Dg Chest 2 View  Result Date: 04/09/2017 CLINICAL DATA:  Chest pain today EXAM: CHEST  2 VIEW COMPARISON:  06/09/2008 FINDINGS: Heart and mediastinal contours are within normal limits. No focal opacities or effusions. No acute bony abnormality. IMPRESSION: No active cardiopulmonary disease. Electronically Signed   By: Charlett Nose M.D.   On: 04/09/2017 10:50    Procedures Procedures (including critical care time)  Medications Ordered in ED Medications - No data to  display   Initial Impression / Assessment and Plan / ED Course  I have reviewed the triage vital signs and the nursing notes.  Pertinent labs & imaging results that were available during my care of the patient were reviewed by me and considered in my medical decision making (see chart for details).     66 year old female presents to the ED today complaining of left-sided cramping chest pain onset 4 days ago. Chest pain is reproducible with palpation. She has no associated shortness of breath, diaphoresis, lower extremity swelling, paresthesias, weakness. She is neurologically intact on exam. Her EKG is normal sinus rhythm without evidence of ischemia. Her initial troponin is also 0. Despite her heart score 4, I feel that she is low risk for acute coronary syndrome based on her history and exam. She is currently now chest pain-free. Low risk well's criteria, do not suspect PE. I have recommended that she follow up with her PCP as well as her cardiologist. Return precautions outlined in patient discharge instructions.  Final Clinical Impressions(s) / ED Diagnoses   Final diagnoses:  Left sided chest pain    New Prescriptions New Prescriptions   No medications on file     Dub Mikes, PA-C 04/09/17 1606    Eber Hong, MD 04/18/17 [phone]

## 2017-04-09 NOTE — ED Provider Notes (Addendum)
Medical screening examination/treatment/procedure(s) were conducted as a shared visit with non-physician practitioner(s) and myself.  I personally evaluated the patient during the encounter.  Clinical Impression:   Final diagnoses:  Left sided chest pain     The patient is a 66 year old female, history of diabetes, history of a normal heart catheterization several years ago, states that she has had mild congestive heart failure but no longer sees with a heart doctor him a takes no aspirin because of an allergic reaction. The patient reports that she has had 4 days of constant left-sided chest pain which is worse with change in position, it is not associated with exertion shortness of breath coughing or fevers or swelling of the legs. On exam the patient doesn't fact have some reproducible tenderness over the left anterior upper chest wall. There is no signs of peripheral edema, abdomen is soft and nontender, no murmurs rubs or gallops, clear lung sounds, vital signs are unremarkable and the EKG is totally normal as is the troponin.Marland Kitchen  Despite the patient's heart score she is low risk based on exam, the fact that she has had constant pain for 4 days with a negative troponin and a totally normal EKG. She can follow-up in the outpatient setting.   EKG Interpretation  Date/Time:  Wednesday April 09 2017 10:16:36 EDT Ventricular Rate:  67 PR Interval:  154 QRS Duration: 90 QT Interval:  444 QTC Calculation: 469 R Axis:   65 Text Interpretation:  Normal sinus rhythm Normal ECG since last tracing no significant change Confirmed by Eber Hong ([id number]) on 04/09/2017 2:46:22 PM        Eber Hong, MD 04/09/17 1447    Eber Hong, MD 04/18/17 [phone]

## 2017-04-09 NOTE — ED Notes (Signed)
Got patient hooked up to the mnitor patient is resting with call bell in reach

## 2017-05-19 ENCOUNTER — Encounter: Payer: Self-pay | Admitting: Family Medicine

## 2017-05-19 ENCOUNTER — Ambulatory Visit (INDEPENDENT_AMBULATORY_CARE_PROVIDER_SITE_OTHER): Payer: Medicare Other | Admitting: Family Medicine

## 2017-05-19 VITALS — BP 158/80 | HR 62 | Temp 97.8°F | Ht 63.5 in | Wt 207.0 lb

## 2017-05-19 DIAGNOSIS — Z23 Encounter for immunization: Secondary | ICD-10-CM | POA: Diagnosis not present

## 2017-05-19 DIAGNOSIS — E2839 Other primary ovarian failure: Secondary | ICD-10-CM | POA: Diagnosis not present

## 2017-05-19 DIAGNOSIS — F17200 Nicotine dependence, unspecified, uncomplicated: Secondary | ICD-10-CM | POA: Diagnosis not present

## 2017-05-19 DIAGNOSIS — E785 Hyperlipidemia, unspecified: Secondary | ICD-10-CM | POA: Diagnosis not present

## 2017-05-19 DIAGNOSIS — Z1231 Encounter for screening mammogram for malignant neoplasm of breast: Secondary | ICD-10-CM | POA: Diagnosis not present

## 2017-05-19 DIAGNOSIS — Z0000 Encounter for general adult medical examination without abnormal findings: Secondary | ICD-10-CM

## 2017-05-19 DIAGNOSIS — I10 Essential (primary) hypertension: Secondary | ICD-10-CM | POA: Diagnosis not present

## 2017-05-19 DIAGNOSIS — Z1239 Encounter for other screening for malignant neoplasm of breast: Secondary | ICD-10-CM

## 2017-05-19 DIAGNOSIS — E119 Type 2 diabetes mellitus without complications: Secondary | ICD-10-CM | POA: Diagnosis not present

## 2017-05-19 LAB — POCT GLYCOSYLATED HEMOGLOBIN (HGB A1C): Hemoglobin A1C: 5.4

## 2017-05-19 MED ORDER — HYDROCHLOROTHIAZIDE 12.5 MG PO TABS: 12.5000 mg | ORAL_TABLET | Freq: Every day | ORAL | 3 refills | Status: DC

## 2017-05-19 NOTE — Progress Notes (Signed)
Subjective:    Patient ID: Alicia [name], female    DOB: 1950-12-16, 66 y.o.   MRN: [id number]   CC: annual physical exam  Doing well overall, has no current complaints. Most bothersome problem to her is bilateral knee pain from OA. She reports her knees are doing well, she is managing pain with massage and knee sleeves for compression. Due for mammo, colonoscopy, DEXA scan.  Got flu shot today Declines pneumonia vaccine today Eye exam due- has appointment scheduled for 11/6  HTN- reports not taking HCTZ. She reports she lost the prescription. She is hesitant to take BP medication as she feels she doesn't need it.   SH- live alone, smokes cigarettes- 4-5 a day. No alcohol, no drug use. Retired. Has 6 children that live in [place]  Smoking status reviewed- current smoker   Review of Systems- denies chest pain, SOB, DOE, unintentional weight loss, headaches, focal weakness.    Objective:  BP (!) 158/80   Pulse 62   Temp 97.8 F (36.6 C) (Oral)   Ht 5' 3.5" (1.613 m)   Wt 207 lb (93.9 kg)   SpO2 98%   BMI 36.09 kg/m  Vitals and nursing note reviewed  General: well nourished, in no acute distress HEENT: normocephalic, TM's visualized bilaterally, no scleral icterus or conjunctival pallor, no nasal discharge, moist mucous membranes, good dentition without erythema or discharge noted in posterior oropharynx Neck: supple, non-tender, without lymphadenopathy Cardiac: RRR, clear S1 and S2, no murmurs, rubs, or gallops Respiratory: clear to auscultation bilaterally, no increased work of breathing Abdomen: soft, nontender, nondistended, no masses or organomegaly. Bowel sounds present Extremities: no edema or cyanosis. Warm, well perfused. 2+ radial and PT pulses bilaterally Skin: warm and dry, no rashes noted Neuro: alert and oriented, no focal deficits   Assessment & Plan:    TOBACCO USER  Currently trying to cut down.  -counseled cessation -praised for  quitting efforts -continue to encourage cessation at future visits  Dyslipidemia  Patient due for cholesterol screening, not currently on any medication.  -lipid panel today, patient is fasting -will inform her of results and initiate treatment based on ASCVD score  Diabetes type 2, controlled (HCC)  Previously on medication but no longer is, she controls this with diet  -A1C today 5.4 indicating good control -continue diet and exercise  Preventative health care  Patient received flu shot today. She declines pneumonia vaccine Mammogram and DEXA scan ordered Information given for patient to get colonoscopy, last one in 2009 at [facility]  HYPERTENSION, BENIGN SYSTEMIC  Not at goal today of <140/90. Manual recheck still elevated at 158/80. Patient asymptomatic. Recent labs indicate good renal function.  -refilled HCTZ -encouraged patient to take this -continue diet and exercise -follow up 2 weeks for RN blood pressure check    Return in about 2 weeks (around 06/02/2017) for BP check- nurse visit.   Dolores Patty, DO Family Medicine Resident PGY-2

## 2017-05-19 NOTE — Assessment & Plan Note (Signed)
  Patient received flu shot today. She declines pneumonia vaccine Mammogram and DEXA scan ordered Information given for patient to get colonoscopy, last one in 2009 at [facility]

## 2017-05-19 NOTE — Patient Instructions (Addendum)
   It was great seeing you today!  Please take the blood pressure medication I prescribed to you. Blood pressure control can decrease your risk of many health problems in the future. Please come back in 2 weeks to have BP checked in our nurse clinic.  We will send you a letter with your cholesterol results.  Please schedule your mammogram, colonoscopy, and DEXA scan as you are due for these tests.   Keep up the great work with cutting down on smoking!  If you have questions or concerns please do not hesitate to call at [phone].  Dolores PattyAngela Andree Heeg, DO PGY-2, [facility] 05/19/2017 10:05 AM

## 2017-05-19 NOTE — Assessment & Plan Note (Signed)
  Currently trying to cut down.  -counseled cessation -praised for quitting efforts -continue to encourage cessation at future visits

## 2017-05-19 NOTE — Assessment & Plan Note (Addendum)
  Not at goal today of <140/90. Manual recheck still elevated at 158/80. Patient asymptomatic. Recent labs indicate good renal function.  -refilled HCTZ -encouraged patient to take this -continue diet and exercise -follow up 2 weeks for RN blood pressure check

## 2017-05-19 NOTE — Assessment & Plan Note (Signed)
  Previously on medication but no longer is, she controls this with diet  -A1C today 5.4 indicating good control -continue diet and exercise

## 2017-05-19 NOTE — Assessment & Plan Note (Signed)
  Patient due for cholesterol screening, not currently on any medication.  -lipid panel today, patient is fasting -will inform her of results and initiate treatment based on ASCVD score

## 2017-05-20 LAB — LIPID PANEL
CHOL/HDL RATIO: 3.4 ratio (ref 0.0–4.4)
Cholesterol, Total: 168 mg/dL (ref 100–199)
HDL: 49 mg/dL (ref >39)
LDL CALC: 103 mg/dL — AB (ref 0–99)
TRIGLYCERIDES: 81 mg/dL (ref 0–149)
VLDL Cholesterol Cal: 16 mg/dL (ref 5–40)

## 2017-05-27 DIAGNOSIS — H401131 Primary open-angle glaucoma, bilateral, mild stage: Secondary | ICD-10-CM | POA: Diagnosis not present

## 2017-05-28 ENCOUNTER — Telehealth: Payer: Self-pay | Admitting: Family Medicine

## 2017-05-28 MED ORDER — ATORVASTATIN CALCIUM 40 MG PO TABS: 40.0000 mg | ORAL_TABLET | Freq: Every day | ORAL | 3 refills | Status: DC

## 2017-05-28 NOTE — Telephone Encounter (Signed)
Called patient to relay lab results. Cholesterol panel elevated, ASCVD 13.4% indicating high intensity statin appropriate. Sent in rx for atorvastatin 40 mg. She states she will pick it up and start taking it. Advised her of side effect of myalgias. Patient also reports she has been taking her BP medication and has nurse appointment coming up to check blood pressure. Asked her to call with any questions or concerns.  Dolores PattyAngela Riccio, DO PGY-2, [facility] 05/28/2017 3:49 PM

## 2017-06-02 ENCOUNTER — Ambulatory Visit: Payer: Medicare Other

## 2017-06-10 ENCOUNTER — Ambulatory Visit (INDEPENDENT_AMBULATORY_CARE_PROVIDER_SITE_OTHER): Payer: Medicare Other

## 2017-06-10 ENCOUNTER — Ambulatory Visit (INDEPENDENT_AMBULATORY_CARE_PROVIDER_SITE_OTHER): Payer: Medicare Other | Admitting: Orthopaedic Surgery

## 2017-06-10 VITALS — Ht 63.0 in | Wt 207.0 lb

## 2017-06-10 DIAGNOSIS — M19012 Primary osteoarthritis, left shoulder: Secondary | ICD-10-CM

## 2017-06-10 DIAGNOSIS — M1711 Unilateral primary osteoarthritis, right knee: Secondary | ICD-10-CM | POA: Diagnosis not present

## 2017-06-10 DIAGNOSIS — M25512 Pain in left shoulder: Secondary | ICD-10-CM

## 2017-06-10 DIAGNOSIS — R079 Chest pain, unspecified: Secondary | ICD-10-CM

## 2017-06-10 DIAGNOSIS — M1712 Unilateral primary osteoarthritis, left knee: Secondary | ICD-10-CM | POA: Diagnosis not present

## 2017-06-10 MED ORDER — LIDOCAINE HCL 1 % IJ SOLN
2.0000 mL | INTRAMUSCULAR | Status: AC | PRN
  Administered 2017-06-10: 2 mL

## 2017-06-10 MED ORDER — BUPIVACAINE HCL 0.5 % IJ SOLN
2.0000 mL | INTRAMUSCULAR | Status: AC | PRN
  Administered 2017-06-10: 2 mL via INTRA_ARTICULAR

## 2017-06-10 MED ORDER — METHYLPREDNISOLONE ACETATE 40 MG/ML IJ SUSP
40.0000 mg | INTRAMUSCULAR | Status: AC | PRN
  Administered 2017-06-10: 40 mg via INTRA_ARTICULAR

## 2017-06-10 MED ORDER — DICLOFENAC SODIUM 1 % TD GEL: 2.0000 g | Freq: Four times a day (QID) | TRANSDERMAL | 5 refills | Status: AC

## 2017-06-10 NOTE — Progress Notes (Signed)
Office Visit Note   Patient: Alicia [name]           Date of Birth: 04/25/1951           MRN: [id number] Visit Date: 06/10/2017              Requested by: Tillman Sersiccio, Angela C, DO [address] [place] [id number] PCP: Tillman Sersiccio, Angela C, DO   Assessment & Plan: Visit Diagnoses:  1. Primary osteoarthritis of left shoulder   2. Primary osteoarthritis of right knee   3. Primary osteoarthritis of left knee     Plan: Impression is bilateral knee degenerative joint disease and left shoulder degenerative joint disease.  Patient overall is able to continue to be active and not be significantly held back by  her arthritis.  She is requesting injections in her knees today and left shoulder injection for which we will schedule.  Prescription for Voltaren gel.  Follow-Up Instructions: Return if symptoms worsen or fail to improve.   Orders:  Orders Placed This Encounter  Procedures  . Large Joint Inj: bilateral knee  . XR Shoulder Left  . XR Knee 1-2 Views Right  . XR KNEE 3 VIEW LEFT   Meds ordered this encounter  Medications  . diclofenac sodium (VOLTAREN) 1 % GEL    Sig: Apply 2 g topically 4 (four) times daily.    Dispense:  1 Tube    Refill:  5      Procedures: Large Joint Inj: bilateral knee on 06/10/2017 10:50 AM Indications: pain Details: 22 G needle  Arthrogram: No  Medications (Right): 2 mL lidocaine 1 %; 2 mL bupivacaine 0.5 %; 40 mg methylPREDNISolone acetate 40 MG/ML Medications (Left): 2 mL lidocaine 1 %; 2 mL bupivacaine 0.5 %; 40 mg methylPREDNISolone acetate 40 MG/ML Outcome: tolerated well, no immediate complications Patient was prepped and draped in the usual sterile fashion.       Clinical Data: No additional findings.   Subjective: Chief Complaint  Patient presents with  . Left Shoulder - Pain  . Left Knee - Pain  . Right Knee - Pain    Patient is a 66 year old female comes in with bilateral knee pain and left shoulder pain.  She has  a diagnosis of arthritis and all 3 joints.  She is overall doing well in terms of activity and quality of life.  She just reports that she is unable to run.  She complains of constant and chronic pain in her shoulder and knees.  Denies any numbness and tingling.  Denies any injuries.    Review of Systems  Constitutional: Negative.   HENT: Negative.   Eyes: Negative.   Respiratory: Negative.   Cardiovascular: Negative.   Endocrine: Negative.   Musculoskeletal: Negative.   Neurological: Negative.   Hematological: Negative.   Psychiatric/Behavioral: Negative.   All other systems reviewed and are negative.    Objective: Vital Signs: Ht 5\' 3"  (1.6 m)   Wt 207 lb (93.9 kg)   BMI 36.67 kg/m   Physical Exam  Constitutional: She is oriented to person, place, and time. She appears well-developed and well-nourished.  HENT:  Head: Normocephalic and atraumatic.  Eyes: EOM are normal.  Neck: Neck supple.  Pulmonary/Chest: Effort normal.  Abdominal: Soft.  Neurological: She is alert and oriented to person, place, and time.  Skin: Skin is warm. Capillary refill takes less than 2 seconds.  Psychiatric: She has a normal mood and affect. Her behavior is normal. Judgment and thought  content normal.  Nursing note and vitals reviewed.   Ortho Exam Bilateral knee exam shows no joint effusion.  Collaterals and cruciates are stable. Left shoulder exam shows mild limitation in range of motion.  Rotator cuff testing is normal. Specialty Comments:  No specialty comments available.  Imaging: Xr Knee 1-2 Views Right  Result Date: 06/10/2017 Advanced degenerative joint disease  Xr Knee 3 View Left  Result Date: 06/10/2017 Advanced degenerative joint disease  Xr Shoulder Left  Result Date: 06/10/2017 Moderate degenerative joint disease    PMFS History: Patient Active Problem List   Diagnosis Date Noted  . Diabetes type 2, controlled (HCC) 05/17/2016  . Preventative health care  05/19/2015  . Renal cyst 05/05/2012  . GERD (gastroesophageal reflux disease) 10/24/2011  . TOBACCO USER 06/18/2010  . HOT FLASHES 05/29/2009  . BACK PAIN, LUMBAR 03/07/2009  . Dyslipidemia 10/06/2006  . OBESITY, NOS 09/18/2006  . SCHIZOPHRENIA 09/18/2006  . Depression, controlled 09/18/2006  . HYPERTENSION, BENIGN SYSTEMIC 09/18/2006  . COR PULMONALE 09/18/2006  . Osteoarthritis 09/18/2006  . APNEA, SLEEP 09/18/2006   Past Medical History:  Diagnosis Date  . CHF (congestive heart failure) (HCC)   . Depression   . Diverticulosis   . Esophageal reflux   . Essential hypertension, benign   . Glaucoma   . Hyperlipidemia   . Internal hemorrhoids   . Obesity   . Osteoarthritis   . Schizophrenia (HCC)   . Sleep apnea   . Type II or unspecified type diabetes mellitus without mention of complication, not stated as uncontrolled     Family History  Problem Relation Age of Onset  . Heart disease Mother   . Diabetes Mother   . Nephrolithiasis Mother   . Diabetes Sister   . Alcohol abuse Brother   . Heart disease Father        CHF  . Diabetes Father   . Hypertension Father   . Nephrolithiasis Sister     Past Surgical History:  Procedure Laterality Date  . ABDOMINAL HYSTERECTOMY    . CARPAL TUNNEL RELEASE     right  . CATARACT EXTRACTION, BILATERAL    . LAPAROSCOPY    . WRIST SURGERY     cyst removed   Social History   Occupational History  . Occupation: disabled  Tobacco Use  . Smoking status: Current Every Day Smoker    Packs/day: 0.25    Types: Cigarettes  . Smokeless tobacco: Never Used  . Tobacco comment: slowly weaning self down  Substance and Sexual Activity  . Alcohol use: No    Alcohol/week: 0.0 oz  . Drug use: No  . Sexual activity: Not on file

## 2017-06-10 NOTE — Addendum Note (Signed)
Addended by: Albertina ParrGARCIA, Laloni Rowton on: 06/10/2017 11:13 AM   Modules accepted: Orders

## 2017-06-11 NOTE — Addendum Note (Signed)
Addended by: Donalee CitrinPEELE, Aristide Waggle L on: 06/11/2017 02:17 PM   Modules accepted: Orders

## 2017-06-17 ENCOUNTER — Ambulatory Visit (INDEPENDENT_AMBULATORY_CARE_PROVIDER_SITE_OTHER): Payer: Medicare Other | Admitting: *Deleted

## 2017-06-17 VITALS — BP 152/98

## 2017-06-17 DIAGNOSIS — I10 Essential (primary) hypertension: Secondary | ICD-10-CM

## 2017-06-17 NOTE — Progress Notes (Signed)
Patient here today for BP check.    BP today is 152/98.  Checked BP in left arm with regular cuff.  Symptoms present: none.  Patient last took BP med today, but admits to missing a "couple here or there".  Educated pt on importance of BP meds everyday.  Routed note to PCP.    Alicia [name], Alicia [name], CMA

## 2017-06-24 ENCOUNTER — Ambulatory Visit (INDEPENDENT_AMBULATORY_CARE_PROVIDER_SITE_OTHER): Payer: Medicare Other | Admitting: Physical Medicine and Rehabilitation

## 2017-06-24 ENCOUNTER — Ambulatory Visit (INDEPENDENT_AMBULATORY_CARE_PROVIDER_SITE_OTHER): Payer: Medicare Other

## 2017-06-24 DIAGNOSIS — G8929 Other chronic pain: Secondary | ICD-10-CM

## 2017-06-24 DIAGNOSIS — M25512 Pain in left shoulder: Secondary | ICD-10-CM

## 2017-06-24 NOTE — Progress Notes (Signed)
Alicia [name] - 66 y.o. female MRN [id number]  Date of birth: August 08, 1950  Office Visit Note: Visit Date: 06/24/2017 PCP: Tillman Sersiccio, Angela C, DO Referred by: Tillman Sersiccio, Angela C, DO  Subjective: Chief Complaint  Patient presents with  . Left Shoulder - Pain   HPI: Alicia [name] is a 66 year old female with bilateral knee pain and chronic left shoulder pain.  She is followed by Dr. Roda ShuttersXu in our office for orthopedic complaints.  A few weeks ago she underwent bilateral knee injections by Dr. Roda ShuttersXu.  She reports these helped a great deal.  She also reports that it seemed to have helped her shoulder as well.  She was having a lot of shoulder pain with movement and raising her arm but she says this is much better at this point.  She still feels like she has some restricted motion but overall the pain in the left shoulder significantly better.  She is not noted any numbness tingling or paresthesias.  No radicular complaints.  No specific trauma but nothing new since she saw Dr. Roda ShuttersXu.  She denies any right shoulder pain.  Left shoulder was painful with movement but doing better.    Review of Systems  Constitutional: Negative.   Respiratory: Negative.   Cardiovascular: Negative.   Musculoskeletal: Positive for joint pain.  Neurological: Negative.   Psychiatric/Behavioral: Negative.   All other systems reviewed and are negative.  Otherwise per HPI.  Assessment & Plan: Visit Diagnoses:  1. Chronic left shoulder pain     Plan: Findings:  Chronic history of left shoulder pain with osteoarthritic changes.  We discussed at length with the patient that the injection is not preventative or at least at this point she does not need a shoulder injection which was requested by Dr. Rhona Leavenshiu.  We did go over at length shoulder exercises and strengthening and she can follow-up with him about that.  I did tell her that if her symptoms were to flareup anytime soon that we can try to get her in as a work in type patient  that it would not be probably same day but we can hopefully get her in fairly quickly for shoulder injection.  She did have some apprehension about that but has had knee injections and we did describe that we would do for her.    Meds & Orders: No orders of the defined types were placed in this encounter.  No orders of the defined types were placed in this encounter.   Follow-up: Return if symptoms worsen or fail to improve, for Dr. Roda ShuttersXu.   Procedures: No procedures performed  No notes on file   Clinical History: No specialty comments available.  She reports that she has been smoking cigarettes.  She has been smoking about 0.25 packs per day. she has never used smokeless tobacco.  Recent Labs    05/19/17 0957  HGBA1C 5.4    Objective:  VS:  HT:    WT:   BMI:     BP:   HR: bpm  TEMP: ( )  RESP:  Physical Exam  Constitutional: She is oriented to person, place, and time. She appears well-developed and well-nourished.  Eyes: Conjunctivae and EOM are normal. Pupils are equal, round, and reactive to light.  Cardiovascular: Normal rate and intact distal pulses.  Pulmonary/Chest: Effort normal.  Musculoskeletal:  Some decreased shoulder range of motion with impingement on the left more than right.  No significant pains with motion.  He does get pain at end  ranges.  Good distal strength.  Neurological: She is alert and oriented to person, place, and time. She exhibits normal muscle tone.  Skin: Skin is warm and dry. No rash noted. No erythema.  Psychiatric: She has a normal mood and affect. Her behavior is normal.  Nursing note and vitals reviewed.   Ortho Exam Imaging: No results found.  Past Medical/Family/Surgical/Social History: Medications & Allergies reviewed per EMR Patient Active Problem List   Diagnosis Date Noted  . Diabetes type 2, controlled (HCC) 05/17/2016  . Preventative health care 05/19/2015  . Renal cyst 05/05/2012  . GERD (gastroesophageal reflux disease)  10/24/2011  . TOBACCO USER 06/18/2010  . HOT FLASHES 05/29/2009  . BACK PAIN, LUMBAR 03/07/2009  . Dyslipidemia 10/06/2006  . OBESITY, NOS 09/18/2006  . SCHIZOPHRENIA 09/18/2006  . Depression, controlled 09/18/2006  . HYPERTENSION, BENIGN SYSTEMIC 09/18/2006  . COR PULMONALE 09/18/2006  . Osteoarthritis 09/18/2006  . APNEA, SLEEP 09/18/2006   Past Medical History:  Diagnosis Date  . CHF (congestive heart failure) (HCC)   . Depression   . Diverticulosis   . Esophageal reflux   . Essential hypertension, benign   . Glaucoma   . Hyperlipidemia   . Internal hemorrhoids   . Obesity   . Osteoarthritis   . Schizophrenia (HCC)   . Sleep apnea   . Type II or unspecified type diabetes mellitus without mention of complication, not stated as uncontrolled    Family History  Problem Relation Age of Onset  . Heart disease Mother   . Diabetes Mother   . Nephrolithiasis Mother   . Diabetes Sister   . Alcohol abuse Brother   . Heart disease Father        CHF  . Diabetes Father   . Hypertension Father   . Nephrolithiasis Sister    Past Surgical History:  Procedure Laterality Date  . ABDOMINAL HYSTERECTOMY    . CARPAL TUNNEL RELEASE     right  . CATARACT EXTRACTION, BILATERAL    . LAPAROSCOPY    . WRIST SURGERY     cyst removed   Social History   Occupational History  . Occupation: disabled  Tobacco Use  . Smoking status: Current Every Day Smoker    Packs/day: 0.25    Types: Cigarettes  . Smokeless tobacco: Never Used  . Tobacco comment: slowly weaning self down  Substance and Sexual Activity  . Alcohol use: No    Alcohol/week: 0.0 oz  . Drug use: No  . Sexual activity: Not on file

## 2017-06-24 NOTE — Patient Instructions (Signed)

## 2017-06-24 NOTE — Progress Notes (Deleted)
Left shoulder pain, - Dye Allergy

## 2017-06-25 ENCOUNTER — Other Ambulatory Visit: Payer: Self-pay | Admitting: Family Medicine

## 2017-06-25 DIAGNOSIS — Z1231 Encounter for screening mammogram for malignant neoplasm of breast: Secondary | ICD-10-CM

## 2017-07-01 ENCOUNTER — Encounter (INDEPENDENT_AMBULATORY_CARE_PROVIDER_SITE_OTHER): Payer: Self-pay | Admitting: Physical Medicine and Rehabilitation

## 2017-07-10 DIAGNOSIS — H401131 Primary open-angle glaucoma, bilateral, mild stage: Secondary | ICD-10-CM | POA: Diagnosis not present

## 2017-07-16 ENCOUNTER — Ambulatory Visit (HOSPITAL_COMMUNITY)
Admission: EM | Admit: 2017-07-16 | Discharge: 2017-07-16 | Disposition: A | Discharge status: Home / Self Care | Payer: Medicare Other | Attending: Family Medicine | Admitting: Family Medicine

## 2017-07-16 ENCOUNTER — Other Ambulatory Visit: Payer: Self-pay

## 2017-07-16 ENCOUNTER — Encounter (HOSPITAL_COMMUNITY): Payer: Self-pay | Admitting: Emergency Medicine

## 2017-07-16 DIAGNOSIS — T2111XA Burn of first degree of chest wall, initial encounter: Secondary | ICD-10-CM

## 2017-07-16 MED ORDER — SILVER SULFADIAZINE 1 % EX CREA: 1.0000 "application " | TOPICAL_CREAM | Freq: Every day | CUTANEOUS | 0 refills | Status: AC

## 2017-07-16 NOTE — Discharge Instructions (Signed)
Please keep blister intact, it may eventually burst, but avoid popping it.  You may apply silvadene cream or neosporin to burn with non-adherent dressing over it. Allowing it to air out and dry is also helpful.  Burn should slowly improve over next 2 weeks as skin replaces itself.

## 2017-07-16 NOTE — ED Provider Notes (Signed)
[facility]    CSN: [id number] Arrival date & time: 07/16/17  1500     History   Chief Complaint Chief Complaint  Patient presents with  . Burn    HPI Alicia [name] is a 66 y.o. female presenting with a burn to her right breast/nipple. She was taking a ham out of the oven on christmas eve and it slipped out of her hand and hot juice splashed onto her chest. She no has blistering and redness to the area. Has not put anything on it.  HPI  Past Medical History:  Diagnosis Date  . CHF (congestive heart failure) (HCC)   . Depression   . Diverticulosis   . Esophageal reflux   . Essential hypertension, benign   . Glaucoma   . Hyperlipidemia   . Internal hemorrhoids   . Obesity   . Osteoarthritis   . Schizophrenia (HCC)   . Sleep apnea   . Type II or unspecified type diabetes mellitus without mention of complication, not stated as uncontrolled     Patient Active Problem List   Diagnosis Date Noted  . Diabetes type 2, controlled (HCC) 05/17/2016  . Preventative health care 05/19/2015  . Renal cyst 05/05/2012  . GERD (gastroesophageal reflux disease) 10/24/2011  . TOBACCO USER 06/18/2010  . HOT FLASHES 05/29/2009  . BACK PAIN, LUMBAR 03/07/2009  . Dyslipidemia 10/06/2006  . OBESITY, NOS 09/18/2006  . SCHIZOPHRENIA 09/18/2006  . Depression, controlled 09/18/2006  . HYPERTENSION, BENIGN SYSTEMIC 09/18/2006  . COR PULMONALE 09/18/2006  . Osteoarthritis 09/18/2006  . APNEA, SLEEP 09/18/2006    Past Surgical History:  Procedure Laterality Date  . ABDOMINAL HYSTERECTOMY    . CARPAL TUNNEL RELEASE     right  . CATARACT EXTRACTION, BILATERAL    . LAPAROSCOPY    . WRIST SURGERY     cyst removed    OB History    No data available       Home Medications    Prior to Admission medications   Medication Sig Start Date End Date Taking? Authorizing Provider  acetaminophen (TYLENOL) 500 MG tablet Take 1,000 mg by mouth every 6 (six) hours as needed  for mild pain.   Yes [provider]  atorvastatin (LIPITOR) 40 MG tablet Take 1 tablet (40 mg total) daily by mouth. 05/28/17  Yes Riccio, Marylene Land C, DO  diclofenac sodium (VOLTAREN) 1 % GEL Apply 2 g topically 4 (four) times daily. 06/10/17  Yes Tarry Kos, MD  hydrochlorothiazide (HYDRODIURIL) 12.5 MG tablet Take 1 tablet (12.5 mg total) by mouth daily. 05/19/17  Yes Tillman Sers, DO  HYDROcodone-acetaminophen (NORCO/VICODIN) 5-325 MG per tablet Take 1-2 tablets every 6 hours as needed for severe pain 02/08/15  Yes Renne Crigler, PA-C  methocarbamol (ROBAXIN) 500 MG tablet Take 1 tablet (500 mg total) by mouth 4 (four) times daily. 02/08/15  Yes Renne Crigler, PA-C  nicotine (NICODERM CQ - DOSED IN MG/24 HR) 7 mg/24hr patch Place 1 patch (7 mg total) onto the skin daily. 05/19/15  Yes Phelps, Jazma Y, DO  TRAVATAN Z 0.004 % SOLN ophthalmic solution Place 1 drop into both eyes every evening. 12/26/14  Yes [provider]  zoster vaccine live, PF, (ZOSTAVAX) 78295 UNT/0.65ML injection Inject 19,400 Units into the skin once. 05/17/15  Yes Caryl Ada Y, DO  silver sulfADIAZINE (SILVADENE) 1 % cream Apply 1 application topically daily for 7 days. 07/16/17 07/23/17  Lew Dawes, PA-C    Family History Family History  Problem Relation Age of Onset  . Heart disease Mother   . Diabetes Mother   . Nephrolithiasis Mother   . Diabetes Sister   . Alcohol abuse Brother   . Heart disease Father        CHF  . Diabetes Father   . Hypertension Father   . Nephrolithiasis Sister     Social History Social History   Tobacco Use  . Smoking status: Current Every Day Smoker    Packs/day: 0.25    Types: Cigarettes  . Smokeless tobacco: Never Used  . Tobacco comment: slowly weaning self down  Substance Use Topics  . Alcohol use: No    Alcohol/week: 0.0 oz  . Drug use: No     Allergies   Aspirin; Banana; Chocolate; Egg yolk; and Ibuprofen   Review of Systems Review  of Systems  Respiratory: Negative for shortness of breath.   Cardiovascular: Negative for chest pain.  Gastrointestinal: Negative for nausea and vomiting.  Skin: Positive for color change.       burn  Neurological: Negative for dizziness and light-headedness.  All other systems reviewed and are negative.    Physical Exam Triage Vital Signs ED Triage Vitals  Enc Vitals Group     BP 07/16/17 1541 134/83     Pulse Rate 07/16/17 1541 75     Resp --      Temp 07/16/17 1541 99.5 F (37.5 C)     Temp Source 07/16/17 1541 Oral     SpO2 07/16/17 1541 99 %     Weight --      Height --      Head Circumference --      Peak Flow --      Pain Score 07/16/17 1538 0     Pain Loc --      Pain Edu? --      Excl. in GC? --    No data found.  Updated Vital Signs BP 134/83 (BP Location: Left Arm)   Pulse 75   Temp 99.5 F (37.5 C) (Oral)   SpO2 99%    Physical Exam  Constitutional: She is oriented to person, place, and time. She appears well-developed and well-nourished. No distress.  HENT:  Head: Normocephalic and atraumatic.  Eyes: Conjunctivae are normal.  Neck: Neck supple.  Cardiovascular: Normal rate.  No murmur heard. Pulmonary/Chest: Effort normal. No respiratory distress.  Abdominal: Soft. There is no tenderness.  Musculoskeletal: She exhibits no edema.  Neurological: She is alert and oriented to person, place, and time.  Skin: Skin is warm and dry. There is erythema.  4 cm of blistering to right breast (2 oclock from nipple) , intact and fluid filled, erythema and hyperpigmentation extending around blister and onto areola.   Psychiatric: She has a normal mood and affect.  Nursing note and vitals reviewed.    UC Treatments / Results  Labs (all labs ordered are listed, but only abnormal results are displayed) Labs Reviewed - No data to display  EKG  EKG Interpretation None       Radiology No results found.  Procedures Procedures (including critical care  time)  Medications Ordered in UC Medications - No data to display   Initial Impression / Assessment and Plan / UC Course  I have reviewed the triage vital signs and the nursing notes.  Pertinent labs & imaging results that were available during my care of the patient were reviewed by me and considered in my medical decision making (see chart  for details).    Burn is superficial, epidermis still intact. Silvadene applied with non adherent dressing over top. Prescribed silvadene cream for at home, concerned about cost, advised she may use OTC bacitracin  Or skin will still heal on its own.   Final Clinical Impressions(s) / UC Diagnoses   Final diagnoses:  Superficial burn of right breast, initial encounter    ED Discharge Orders        Ordered    silver sulfADIAZINE (SILVADENE) 1 % cream  Daily    Comments:  Patient is concerned about cost- if there is a smaller version or cheaper version for burns, she would prefer   07/16/17 1600       Controlled Substance Prescriptions [place] Controlled Substance Registry consulted? Not Applicable   Lew DawesWieters, Tayvian Holycross C, [place] JerseyPA-C 07/16/17 1611

## 2017-07-16 NOTE — ED Triage Notes (Signed)
Pt was taking her ham out of the oven and spilled the hot juice on her right breast.  Pt has a blister to the right breast and some discoloration.

## 2017-07-29 ENCOUNTER — Ambulatory Visit: Payer: Medicare Other

## 2017-07-29 ENCOUNTER — Other Ambulatory Visit: Payer: Medicare Other

## 2017-07-31 ENCOUNTER — Encounter: Payer: Self-pay | Admitting: Cardiology

## 2017-07-31 ENCOUNTER — Ambulatory Visit (INDEPENDENT_AMBULATORY_CARE_PROVIDER_SITE_OTHER): Payer: Medicare Other | Admitting: Cardiology

## 2017-07-31 VITALS — BP 142/86 | HR 74 | Ht 63.0 in | Wt 209.4 lb

## 2017-07-31 DIAGNOSIS — Z01810 Encounter for preprocedural cardiovascular examination: Secondary | ICD-10-CM | POA: Diagnosis not present

## 2017-07-31 DIAGNOSIS — I10 Essential (primary) hypertension: Secondary | ICD-10-CM

## 2017-07-31 DIAGNOSIS — R0602 Shortness of breath: Secondary | ICD-10-CM | POA: Diagnosis not present

## 2017-07-31 DIAGNOSIS — Z720 Tobacco use: Secondary | ICD-10-CM | POA: Diagnosis not present

## 2017-07-31 DIAGNOSIS — E119 Type 2 diabetes mellitus without complications: Secondary | ICD-10-CM | POA: Diagnosis not present

## 2017-07-31 NOTE — Progress Notes (Signed)
Cardiology Office Note:    Date:  07/31/2017   ID:  Alicia [name], DOB [birth year], MRN [id number]  PCP:  Tillman Sers, DO  Cardiologist:  Donato Schultz, MD   Referring MD: Tillman Sers, DO    History of Present Illness:    Alicia [name] is a 67 y.o. female here for preoperative cardiac risk stratification at the request of Dr.Xu prior to potential knee procedures.  Cardiac risk factors include tobacco use, diabetes well controlled, hypertension.  She has been seeing [place] orth[facility] with left shoulder pain which has been chronic as well as bilateral knee pain.  She states that she has no cartilage.  She has been taking atorvastatin recently since the beginning of November 2018.  She states that back in 2001 she had an episode of heart failure or fluid buildup around her heart she states.  She has not been on any long-term cardiomyopathy medications.  She takes low-dose hydrochlorothiazide 12.5 mg daily for blood pressure.  She has mild SOB with activity.  She used to go to the gym several years ago but has not been doing this recently likely because of arthritis.  She is able to go up a flight of stairs but she does feel short of breath with this activity.  She denies any chest pain.  Smokes. Not as often she states.  She was able to quit for 4 years.  Mon and dad sister with MI, HTN.   Past Medical History:  Diagnosis Date  . CHF (congestive heart failure) (HCC)   . Depression   . Diverticulosis   . Esophageal reflux   . Essential hypertension, benign   . Glaucoma   . Hyperlipidemia   . Internal hemorrhoids   . Obesity   . Osteoarthritis   . Schizophrenia (HCC)   . Sleep apnea   . Type II or unspecified type diabetes mellitus without mention of complication, not stated as uncontrolled     Past Surgical History:  Procedure Laterality Date  . ABDOMINAL HYSTERECTOMY    . CARPAL TUNNEL RELEASE     right  . CATARACT EXTRACTION, BILATERAL    .  LAPAROSCOPY    . WRIST SURGERY     cyst removed    Current Medications: Current Meds  Medication Sig  . atorvastatin (LIPITOR) 40 MG tablet Take 1 tablet (40 mg total) daily by mouth.  . diclofenac sodium (VOLTAREN) 1 % GEL Apply 2 g topically 4 (four) times daily.  . hydrochlorothiazide (HYDRODIURIL) 12.5 MG tablet Take 1 tablet (12.5 mg total) by mouth daily.  . TRAVATAN Z 0.004 % SOLN ophthalmic solution Place 1 drop into both eyes every evening.  . zoster vaccine live, PF, (ZOSTAVAX) 40981 UNT/0.65ML injection Inject 19,400 Units into the skin once.     Allergies:   Aspirin; Banana; Chocolate; Egg yolk; and Ibuprofen   Social History   Socioeconomic History  . Marital status: Widowed    Spouse name: None  . Number of children: 6  . Years of education: None  . Highest education level: None  Social Needs  . Financial resource strain: None  . Food insecurity - worry: None  . Food insecurity - inability: None  . Transportation needs - medical: None  . Transportation needs - non-medical: None  Occupational History  . Occupation: disabled  Tobacco Use  . Smoking status: Current Every Day Smoker    Packs/day: 0.25    Types: Cigarettes  . Smokeless tobacco: Never Used  .  Tobacco comment: slowly weaning self down  Substance and Sexual Activity  . Alcohol use: No    Alcohol/week: 0.0 oz  . Drug use: No  . Sexual activity: None  Other Topics Concern  . None  Social History Narrative  . None     Family History: The patient's family history includes Alcohol abuse in her brother; Diabetes in her father, mother, and sister; Heart disease in her father and mother; Hypertension in her father; Nephrolithiasis in her mother and sister.  ROS:   Please see the history of present illness.    Shortness of breath, snoring, wheezing, muscle pain, joint pain, dizziness all other systems reviewed and are negative.  EKGs/Labs/Other Studies Reviewed:    The following studies were  reviewed today: Prior lab work, office notes, EKG reviewed  EKG:  EKG is not ordered today.  04/09/17 - NSR no ischemic changes.   Recent Labs: 04/09/2017: BUN 5; Creatinine, Ser 0.64; Hemoglobin 11.7; Platelets 272; Potassium 3.1; Sodium 140  Recent Lipid Panel    Component Value Date/Time   CHOL 168 05/19/2017 1043   TRIG 81 05/19/2017 1043   HDL 49 05/19/2017 1043   CHOLHDL 3.4 05/19/2017 1043   CHOLHDL 3.5 05/17/2016 1004   VLDL 26 05/17/2016 1004   LDLCALC 103 (H) 05/19/2017 1043   LDLDIRECT 124 (H) 12/01/2012 1622    Physical Exam:    VS:  BP (!) 142/86   Pulse 74   Ht 5\' 3"  (1.6 m)   Wt 209 lb 6.4 oz (95 kg)   SpO2 98%   BMI 37.09 kg/m     Wt Readings from Last 3 Encounters:  07/31/17 209 lb 6.4 oz (95 kg)  06/10/17 207 lb (93.9 kg)  05/19/17 207 lb (93.9 kg)     GEN:  Well nourished, well developed in no acute distress HEENT: Normal NECK: No JVD; No carotid bruits LYMPHATICS: No lymphadenopathy CARDIAC: RRR, no murmurs, rubs, gallops RESPIRATORY:  Clear to auscultation without rales, wheezing or rhonchi  ABDOMEN: Soft, non-tender, non-distended MUSCULOSKELETAL:  No edema; No deformity  SKIN: Warm and dry NEUROLOGIC:  Alert and oriented x 3 PSYCHIATRIC:  Normal affect   ASSESSMENT:    1. Pre-operative cardiovascular examination   2. HYPERTENSION, BENIGN SYSTEMIC   3. Shortness of breath   4. Well controlled diabetes mellitus (HCC)   5. Tobacco use    PLAN:    In order of problems listed above:  Preoperative cardiac risk stratification - Given her shortness of breath with activity and inability to fully exert herself because of knee arthritis as well as her cardiac risk factors of diabetes, smoking, hypertension and age as well as family history, I think it would be prudent to proceed with nuclear stress test, pharmacologic because she is unable to walk on treadmill effectively, prior to any general anesthesia surgical procedures.  She is amenable to  this plan.  Tobacco use -She has quit in the past.  She is trying again.  Continue to encourage cessation.  Hyperlipidemia - Started atorvastatin in November 2018.  Lab work reviewed.  Diabetes well controlled -Hemoglobin A1c 5.4.  Diet control.  Essential hypertension - Slightly elevated today at 142/86.  Continue to encourage HCTZ use.  Diet, exercise, weight loss.  Followed by Dr. Wonda Olds.  We will follow-up with results of testing.  Please let us know if we can be of further assistance.  Medication Adjustments/Labs and Tests Ordered: Current medicines are reviewed at length with the patient today.  Concerns regarding medicines are outlined above.  Orders Placed This Encounter  Procedures  . Myocardial Perfusion Imaging   No orders of the defined types were placed in this encounter.   Signed, Donato Schultz, MD  07/31/2017 9:46 AM    [facility]

## 2017-07-31 NOTE — Patient Instructions (Signed)
Medication Instructions:  The current medical regimen is effective;  continue present plan and medications.  Testing/Procedures: Your physician has requested that you have a lexiscan myoview. For further information please visit [email or web address]. Please follow instruction sheet, as given.  Follow-Up: Follow up as needed after the above testing.  Thank you for choosing [facility]!!     

## 2017-08-04 ENCOUNTER — Telehealth (HOSPITAL_COMMUNITY): Payer: Self-pay | Admitting: *Deleted

## 2017-08-04 NOTE — Telephone Encounter (Signed)
Patient given detailed instructions per Myocardial Perfusion Study Information Sheet for the test on 08/06/17 at 1000. Patient notified to arrive 15 minutes early and that it is imperative to arrive on time for appointment to keep from having the test rescheduled.  If you need to cancel or reschedule your appointment, please call the office within 24 hours of your appointment. . Patient verbalized understanding.Evea Sheek, Adelene IdlerCynthia W

## 2017-08-06 ENCOUNTER — Ambulatory Visit (HOSPITAL_COMMUNITY): Payer: Medicare Other | Attending: Cardiology

## 2017-08-06 DIAGNOSIS — Z01810 Encounter for preprocedural cardiovascular examination: Secondary | ICD-10-CM | POA: Diagnosis not present

## 2017-08-06 DIAGNOSIS — I10 Essential (primary) hypertension: Secondary | ICD-10-CM | POA: Insufficient documentation

## 2017-08-06 DIAGNOSIS — R0602 Shortness of breath: Secondary | ICD-10-CM | POA: Insufficient documentation

## 2017-08-06 LAB — MYOCARDIAL PERFUSION IMAGING
CHL CUP NUCLEAR SDS: 1
CHL CUP NUCLEAR SRS: 3
CHL CUP RESTING HR STRESS: 71 {beats}/min
LHR: 0.41
LV dias vol: 92 mL (ref 46–106)
LVSYSVOL: 36 mL
NUC STRESS TID: 1.06
Peak HR: 96 {beats}/min
SSS: 4

## 2017-08-06 MED ORDER — REGADENOSON 0.4 MG/5ML IV SOLN
0.4000 mg | Freq: Once | INTRAVENOUS | Status: AC
  Administered 2017-08-06: 0.4 mg via INTRAVENOUS

## 2017-08-06 MED ORDER — TECHNETIUM TC 99M TETROFOSMIN IV KIT
31.9000 | PACK | Freq: Once | INTRAVENOUS | Status: AC | PRN
  Administered 2017-08-06: 31.9 via INTRAVENOUS
  Filled 2017-08-06: qty 32

## 2017-08-06 MED ORDER — TECHNETIUM TC 99M TETROFOSMIN IV KIT
11.0000 | PACK | Freq: Once | INTRAVENOUS | Status: AC | PRN
  Administered 2017-08-06: 11 via INTRAVENOUS
  Filled 2017-08-06: qty 11

## 2017-08-15 DIAGNOSIS — H401131 Primary open-angle glaucoma, bilateral, mild stage: Secondary | ICD-10-CM | POA: Diagnosis not present

## 2017-09-09 DIAGNOSIS — H401131 Primary open-angle glaucoma, bilateral, mild stage: Secondary | ICD-10-CM | POA: Diagnosis not present

## 2017-12-12 ENCOUNTER — Encounter: Payer: Self-pay | Admitting: Gastroenterology

## 2017-12-12 DIAGNOSIS — H401131 Primary open-angle glaucoma, bilateral, mild stage: Secondary | ICD-10-CM | POA: Diagnosis not present

## 2018-01-02 DIAGNOSIS — H401131 Primary open-angle glaucoma, bilateral, mild stage: Secondary | ICD-10-CM | POA: Diagnosis not present

## 2018-01-15 DIAGNOSIS — H401111 Primary open-angle glaucoma, right eye, mild stage: Secondary | ICD-10-CM | POA: Diagnosis not present

## 2018-02-05 DIAGNOSIS — H401121 Primary open-angle glaucoma, left eye, mild stage: Secondary | ICD-10-CM | POA: Diagnosis not present

## 2018-02-05 DIAGNOSIS — H401122 Primary open-angle glaucoma, left eye, moderate stage: Secondary | ICD-10-CM | POA: Diagnosis not present

## 2018-02-16 ENCOUNTER — Ambulatory Visit
Admission: RE | Admit: 2018-02-16 | Discharge: 2018-02-16 | Disposition: A | Discharge status: Home / Self Care | Payer: Medicare Other | Source: Ambulatory Visit | Attending: Family Medicine | Admitting: Family Medicine

## 2018-02-16 DIAGNOSIS — Z1231 Encounter for screening mammogram for malignant neoplasm of breast: Secondary | ICD-10-CM

## 2018-02-16 DIAGNOSIS — E2839 Other primary ovarian failure: Secondary | ICD-10-CM

## 2018-02-16 DIAGNOSIS — M85852 Other specified disorders of bone density and structure, left thigh: Secondary | ICD-10-CM | POA: Diagnosis not present

## 2018-02-16 DIAGNOSIS — Z780 Asymptomatic menopausal state: Secondary | ICD-10-CM | POA: Diagnosis not present

## 2018-04-17 DIAGNOSIS — H401131 Primary open-angle glaucoma, bilateral, mild stage: Secondary | ICD-10-CM | POA: Diagnosis not present

## 2018-04-17 DIAGNOSIS — I10 Essential (primary) hypertension: Secondary | ICD-10-CM | POA: Diagnosis not present

## 2018-04-17 DIAGNOSIS — H43811 Vitreous degeneration, right eye: Secondary | ICD-10-CM | POA: Diagnosis not present

## 2018-05-05 ENCOUNTER — Ambulatory Visit (INDEPENDENT_AMBULATORY_CARE_PROVIDER_SITE_OTHER): Payer: Medicare Other

## 2018-05-05 VITALS — BP 186/94 | HR 67 | Temp 98.7°F | Ht 63.0 in | Wt 206.8 lb

## 2018-05-05 DIAGNOSIS — Z23 Encounter for immunization: Secondary | ICD-10-CM | POA: Diagnosis not present

## 2018-05-05 DIAGNOSIS — Z1211 Encounter for screening for malignant neoplasm of colon: Secondary | ICD-10-CM | POA: Diagnosis not present

## 2018-05-05 DIAGNOSIS — Z0000 Encounter for general adult medical examination without abnormal findings: Secondary | ICD-10-CM

## 2018-05-05 NOTE — Patient Instructions (Addendum)
Alicia [name] , Thank you for taking time to come for your Medicare Wellness Visit. I appreciate your ongoing commitment to your health goals. Please review the following plan we discussed and let me know if I can assist you in the future.   Your appointment with Dr Wonda Olds is 06/04/18. You will get a phone call to schedule your colonoscopy.   These are the goals we discussed: Goals   None     This is a list of the screening recommended for you and due dates:  Health Maintenance  Topic Date Due  . Colon Cancer Screening  12/12/2000  . Complete foot exam   05/17/2017  . Urine Protein Check  05/17/2017  . Hemoglobin A1C  11/17/2017  . Pneumonia vaccines (1 of 2 - PCV13) 05/06/2019*  . Lipid (cholesterol) test  05/19/2018  . Eye exam for diabetics  05/06/2019  . Mammogram  02/17/2020  . Tetanus Vaccine  05/18/2025  . Flu Shot  Completed  . DEXA scan (bone density measurement)  Completed  .  Hepatitis C: One time screening is recommended by Center for Disease Control  (CDC) for  adults born from [birth year] through 1965.   Completed  *Topic was postponed. The date shown is not the original due date.    Colonoscopy, Adult A colonoscopy is an exam to look at the entire large intestine. During the exam, a lubricated, bendable tube is inserted into the anus and then passed into the rectum, colon, and other parts of the large intestine. A colonoscopy is often done as a part of normal colorectal screening or in response to certain symptoms, such as anemia, persistent diarrhea, abdominal pain, and blood in the stool. The exam can help screen for and diagnose medical problems, including:  Tumors.  Polyps.  Inflammation.  Areas of bleeding.  Tell a health care provider about:  Any allergies you have.  All medicines you are taking, including vitamins, herbs, eye drops, creams, and over-the-counter medicines.  Any problems you or family members have had with anesthetic medicines.  Any  blood disorders you have.  Any surgeries you have had.  Any medical conditions you have.  Any problems you have had passing stool. What are the risks? Generally, this is a safe procedure. However, problems may occur, including:  Bleeding.  A tear in the intestine.  A reaction to medicines given during the exam.  Infection (rare).  What happens before the procedure? Eating and drinking restrictions Follow instructions from your health care provider about eating and drinking, which may include:  A few days before the procedure - follow a low-fiber diet. Avoid nuts, seeds, dried fruit, raw fruits, and vegetables.  1-3 days before the procedure - follow a clear liquid diet. Drink only clear liquids, such as clear broth or bouillon, black coffee or tea, clear juice, clear soft drinks or sports drinks, gelatin dessert, and popsicles. Avoid any liquids that contain red or purple dye.  On the day of the procedure - do not eat or drink anything during the 2 hours before the procedure, or within the time period that your health care provider recommends.  Bowel prep If you were prescribed an oral bowel prep to clean out your colon:  Take it as told by your health care provider. Starting the day before your procedure, you will need to drink a large amount of medicated liquid. The liquid will cause you to have multiple loose stools until your stool is almost clear or light green.  If your skin or anus gets irritated from diarrhea, you may use these to relieve the irritation: ? Medicated wipes, such as adult wet wipes with aloe and vitamin E. ? A skin soothing-product like petroleum jelly.  If you vomit while drinking the bowel prep, take a break for up to 60 minutes and then begin the bowel prep again. If vomiting continues and you cannot take the bowel prep without vomiting, call your health care provider.  General instructions  Ask your health care provider about changing or stopping  your regular medicines. This is especially important if you are taking diabetes medicines or blood thinners.  Plan to have someone take you home from the hospital or clinic. What happens during the procedure?  An IV tube may be inserted into one of your veins.  You will be given medicine to help you relax (sedative).  To reduce your risk of infection: ? Your health care team will wash or sanitize their hands. ? Your anal area will be washed with soap.  You will be asked to lie on your side with your knees bent.  Your health care provider will lubricate a long, thin, flexible tube. The tube will have a camera and a light on the end.  The tube will be inserted into your anus.  The tube will be gently eased through your rectum and colon.  Air will be delivered into your colon to keep it open. You may feel some pressure or cramping.  The camera will be used to take images during the procedure.  A small tissue sample may be removed from your body to be examined under a microscope (biopsy). If any potential problems are found, the tissue will be sent to a lab for testing.  If small polyps are found, your health care provider may remove them and have them checked for cancer cells.  The tube that was inserted into your anus will be slowly removed. The procedure may vary among health care providers and hospitals. What happens after the procedure?  Your blood pressure, heart rate, breathing rate, and blood oxygen level will be monitored until the medicines you were given have worn off.  Do not drive for 24 hours after the exam.  You may have a small amount of blood in your stool.  You may pass gas and have mild abdominal cramping or bloating due to the air that was used to inflate your colon during the exam.  It is up to you to get the results of your procedure. Ask your health care provider, or the department performing the procedure, when your results will be ready. This information  is not intended to replace advice given to you by your health care provider. Make sure you discuss any questions you have with your health care provider. Document Released: 07/05/2000 Document Revised: 05/08/2016 Document Reviewed: 09/19/2015 Elsevier Interactive Patient Education  2018 [employer].

## 2018-05-05 NOTE — Progress Notes (Signed)
Subjective:   Alicia [name] is a 67 y.o. female who presents for an Initial Medicare Annual Wellness Visit. The patient was informed that the wellness visit is to identify future health risk and educate and initiate measures that can reduce risk for increased disease through the lifespan.   Review of Systems    Physical assessment deferred to PCP.   Cardiac Risk Factors include: advanced age (>23men, >8 women);hypertension;smoking/ tobacco exposure;obesity (BMI >30kg/m2)    Objective:    Today's Vitals   05/05/18 1030 05/05/18 1054  BP: (!) 190/100 (!) 186/94  Pulse: 67   Temp: 98.7 F (37.1 C)   TempSrc: Oral   SpO2: 99%   Weight: 206 lb 12.8 oz (93.8 kg)   Height: 5\' 3"  (1.6 m)   PainSc: 0-No pain    Body mass index is 36.63 kg/m.  Advanced Directives 05/05/2018 05/19/2017 04/09/2017 02/27/2017 05/17/2016 05/17/2015 02/08/2015  Does Patient Have a Medical Advance Directive? No No No No No No No  Does patient want to make changes to medical advance directive? - - - - - - -  Would patient like information on creating a medical advance directive? Yes (MAU/Ambulatory/Procedural Areas - Information given) No - Patient declined No - Patient declined No - Patient declined;Yes (MAU/Ambulatory/Procedural Areas - Information given) No - patient declined information Yes - Educational materials given No - patient declined information    Current Medications (verified) Outpatient Encounter Medications as of 05/05/2018  Medication Sig  . atorvastatin (LIPITOR) 40 MG tablet Take 1 tablet (40 mg total) daily by mouth. (Patient not taking: Reported on 05/05/2018)  . diclofenac sodium (VOLTAREN) 1 % GEL Apply 2 g topically 4 (four) times daily. (Patient not taking: Reported on 05/05/2018)  . hydrochlorothiazide (HYDRODIURIL) 12.5 MG tablet Take 1 tablet (12.5 mg total) by mouth daily. (Patient not taking: Reported on 05/05/2018)  . [DISCONTINUED] TRAVATAN Z 0.004 % SOLN ophthalmic  solution Place 1 drop into both eyes every evening.  . [DISCONTINUED] zoster vaccine live, PF, (ZOSTAVAX) 16109 UNT/0.65ML injection Inject 19,400 Units into the skin once.   No facility-administered encounter medications on file as of 05/05/2018.     Allergies (verified) Aspirin; Banana; Chocolate; Egg yolk; and Ibuprofen   History: Past Medical History:  Diagnosis Date  . CHF (congestive heart failure) (HCC)   . Depression   . Diverticulosis   . Esophageal reflux   . Essential hypertension, benign   . Glaucoma   . Hyperlipidemia   . Internal hemorrhoids   . Obesity   . Osteoarthritis   . Schizophrenia (HCC)   . Sleep apnea   . Type II or unspecified type diabetes mellitus without mention of complication, not stated as uncontrolled    Past Surgical History:  Procedure Laterality Date  . ABDOMINAL HYSTERECTOMY    . CARPAL TUNNEL RELEASE     right  . CATARACT EXTRACTION, BILATERAL    . EYE SURGERY    . LAPAROSCOPY    . WRIST SURGERY     cyst removed   Family History  Problem Relation Age of Onset  . Heart disease Mother   . Diabetes Mother   . Nephrolithiasis Mother   . Diabetes Sister   . Hypertension Sister   . Alcohol abuse Brother   . Diabetes Brother   . Hypertension Brother   . Heart disease Father        CHF  . Diabetes Father   . Hypertension Father   . Nephrolithiasis Sister   .  Diabetes Sister   . Hypertension Sister   . Diabetes Sister   . Hypertension Sister   . Diabetes Sister   . Hypertension Sister   . Diabetes Sister   . Hypertension Sister   . Diabetes Sister   . Hypertension Sister   . Diabetes Brother   . Hypertension Brother   . Diabetes Brother   . Hypertension Brother    Social History   Socioeconomic History  . Marital status: Widowed    Spouse name: Not on file  . Number of children: 6  . Years of education: 22  . Highest education level: 11th grade  Occupational History  . Occupation: disabled  Social Needs  .  Financial resource strain: Not very hard  . Food insecurity:    Worry: Never true    Inability: Never true  . Transportation needs:    Medical: No    Non-medical: No  Tobacco Use  . Smoking status: Current Every Day Smoker    Packs/day: 0.25    Types: Cigarettes  . Smokeless tobacco: Never Used  . Tobacco comment: slowly weaning self down  Substance and Sexual Activity  . Alcohol use: No    Alcohol/week: 0.0 standard drinks  . Drug use: No  . Sexual activity: Not Currently    Birth control/protection: Condom  Lifestyle  . Physical activity:    Days per week: 0 days    Minutes per session: 0 min  . Stress: Not at all  Relationships  . Social connections:    Talks on phone: More than three times a week    Gets together: Once a week    Attends religious service: Never    Active member of club or organization: No    Attends meetings of clubs or organizations: Never    Relationship status: Widowed  Other Topics Concern  . Not on file  Social History Narrative   Lives alone in a house, one level house; no problems getting in and out. No pets.   House has smoke alarms.   Eats mostly vegetables and meat once a week.       Likes to sew, cook, and games on phone. Enjoys talking.     Tobacco Counseling Ready to quit: No Counseling given: Yes Comment: slowly weaning self down   Clinical Intake:  Pre-visit preparation completed: Yes  Pain : No/denies pain Pain Score: 0-No pain     Nutritional Status: BMI > 30  Obese Diabetes: No  How often do you need to have someone help you when you read instructions, pamphlets, or other written materials from your doctor or pharmacy?: 2 - Rarely What is the last grade level you completed in school?: 11th grade  Interpreter Needed?: No      Activities of Daily Living In your present state of health, do you have any difficulty performing the following activities: 05/05/2018  Hearing? N  Vision? N  Difficulty concentrating  or making decisions? N  Walking or climbing stairs? N  Dressing or bathing? N  Doing errands, shopping? N  Preparing Food and eating ? N  Using the Toilet? N  In the past six months, have you accidently leaked urine? N  Do you have problems with loss of bowel control? N  Managing your Medications? N  Managing your Finances? N  Housekeeping or managing your Housekeeping? N  Some recent data might be hidden     Immunizations and Health Maintenance Immunization History  Administered Date(s) Administered  . Influenza  Split 05/08/2011, 05/05/2012  . Influenza Whole 05/16/2008, 04/25/2009, 04/25/2010  . Influenza,inj,Quad PF,6+ Mos 05/25/2013, 05/04/2014, 05/17/2015, 05/17/2016, 05/19/2017, 05/05/2018  . Pneumococcal Polysaccharide-23 05/04/2014  . Td 02/19/2002  . Tdap 05/19/2015   Health Maintenance Due  Topic Date Due  . COLONOSCOPY  12/12/2000  . FOOT EXAM  05/17/2017  . URINE MICROALBUMIN  05/17/2017  . HEMOGLOBIN A1C  11/17/2017    Patient Care Team: Tillman Sers, DO as PCP - General Jake Bathe, MD as PCP - Cardiology (Cardiology) Augustin Schooling, MD as Consulting Physician (Ophthalmology)  Indicate any recent Medical Services you may have received from other than [facility] providers in the past year (date may be approximate).     Assessment:  This is a routine wellness examination for Caitlyn. Patient thought she was here to see PCP for a physical. Consented to do AWV. Patient is not taking any medications and has not been for awhile. Patient also states she is not diabetic that her blood tests show this. Patient is oriented x 3 and pass cog tests but hard to keep patient on track with questioning.  Flu vaccine given today. Colonoscopy referral entered.   Hearing/Vision screen  Hearing Screening   Method: Audiometry   125Hz  250Hz  500Hz  1000Hz  2000Hz  3000Hz  4000Hz  6000Hz  8000Hz   Right ear:  Pass Pass Pass Pass  Pass    Left ear:  Pass Pass Pass Pass  Pass    Vision  Screening Comments: Patient declined. Recent eye surgery.  Dietary issues and exercise activities discussed: Current Exercise Habits: The patient does not participate in regular exercise at present, Exercise limited by: None identified  Goals   None    Depression Screen PHQ 2/9 Scores 05/05/2018 05/19/2017 02/27/2017 05/17/2016 05/17/2015  PHQ - 2 Score 0 0 0 0 0    Fall Risk Fall Risk  05/05/2018 02/27/2017 05/17/2016 05/17/2015 08/09/2014  Falls in the past year? No Yes No No No  Number falls in past yr: - 2 or more - - -  Injury with Fall? - No - - -  Comment - only bump and brusies - - -    Is the patient's home free of loose throw rugs in walkways, pet beds, electrical cords, etc?   yes      Grab bars in the bathroom? no      Handrails on the stairs?   yes      Adequate lighting?   yes  Cognitive Function: MMSE - Mini Mental State Exam 05/05/2018  Orientation to time 5  Orientation to Place 5  Registration 3  Attention/ Calculation 5  Recall 3  Language- name 2 objects 2  Language- repeat 1  Language- follow 3 step command 3  Language- read & follow direction 1  Write a sentence 1  Copy design 1  Total score 30     6CIT Screen 05/05/2018  What Year? 0 points  What month? 0 points  What time? 0 points  Count back from 20 0 points  Months in reverse 0 points  Repeat phrase 0 points  Total Score 0    Screening Tests Health Maintenance  Topic Date Due  . COLONOSCOPY  12/12/2000  . FOOT EXAM  05/17/2017  . URINE MICROALBUMIN  05/17/2017  . HEMOGLOBIN A1C  11/17/2017  . PNA vac Low Risk Adult (1 of 2 - PCV13) 05/06/2019 (Originally 12/13/2015)  . LIPID PANEL  05/19/2018  . OPHTHALMOLOGY EXAM  05/06/2019  . MAMMOGRAM  02/17/2020  . TETANUS/TDAP  05/18/2025  . INFLUENZA VACCINE  Completed  . DEXA SCAN  Completed  . Hepatitis C Screening  Completed    Cancer Screenings: Lung: Low Dose CT Chest recommended if Age 79-80 years, 30 pack-year currently smoking  OR have quit w/in 15years. Patient does qualify. Breast: Up to date on Mammogram? Yes   Up to date of Bone Density/Dexa? Yes Colorectal: No. Referral entered.   Additional Screenings:  Hepatitis C Screening: complete No DM screenings done b/c patient states she is not diabetic.    Plan:  Appointment made with PCP to f/u on HTN and DM. Patient is on no medications per her choice. Patient BP high today but patient thinks it is because she is here. No symptoms with her elevated BP. Patient states she does not have DM.  Flu vaccine updated. Colonoscopy referral made.   I have personally reviewed and noted the following in the patient's chart:   . Medical and social history . Use of alcohol, tobacco or illicit drugs  . Current medications and supplements . Functional ability and status . Nutritional status . Physical activity . Advanced directives . List of other physicians . Hospitalizations, surgeries, and ER visits in previous 12 months . Vitals . Screenings to include cognitive, depression, and falls . Referrals and appointments  In addition, I have reviewed and discussed with patient certain preventive protocols, quality metrics, and best practice recommendations. A written personalized care plan for preventive services as well as general preventive health recommendations were provided to patient.     Nigel Mormon, RN   05/05/2018

## 2018-06-01 ENCOUNTER — Encounter: Payer: Self-pay | Admitting: Family Medicine

## 2018-06-01 ENCOUNTER — Other Ambulatory Visit: Payer: Self-pay

## 2018-06-01 ENCOUNTER — Ambulatory Visit (INDEPENDENT_AMBULATORY_CARE_PROVIDER_SITE_OTHER): Payer: Medicare Other | Admitting: Family Medicine

## 2018-06-01 VITALS — BP 176/100 | HR 64 | Temp 98.3°F | Ht 63.0 in | Wt 208.8 lb

## 2018-06-01 DIAGNOSIS — F17200 Nicotine dependence, unspecified, uncomplicated: Secondary | ICD-10-CM | POA: Diagnosis not present

## 2018-06-01 DIAGNOSIS — E785 Hyperlipidemia, unspecified: Secondary | ICD-10-CM

## 2018-06-01 DIAGNOSIS — E119 Type 2 diabetes mellitus without complications: Secondary | ICD-10-CM | POA: Diagnosis not present

## 2018-06-01 DIAGNOSIS — I10 Essential (primary) hypertension: Secondary | ICD-10-CM | POA: Diagnosis not present

## 2018-06-01 LAB — POCT GLYCOSYLATED HEMOGLOBIN (HGB A1C): HbA1c, POC (prediabetic range): 5.9 % (ref 5.7–6.4)

## 2018-06-01 MED ORDER — LOSARTAN POTASSIUM 50 MG PO TABS: 50.0000 mg | ORAL_TABLET | Freq: Every day | ORAL | 3 refills | Status: DC

## 2018-06-01 MED ORDER — HYDROCHLOROTHIAZIDE 12.5 MG PO TABS: 12.5000 mg | ORAL_TABLET | Freq: Every day | ORAL | 3 refills | Status: DC

## 2018-06-01 NOTE — Progress Notes (Signed)
   [name] Phone: [phone]   cc: high blood pressure  Subjective:  HTN: patient was seen for annual medicare exam last month where her blood pressure was 190/100. Patient has been taking HCTZ but used to be on 3 medications for HTN at one point.  She denied any symtpoms such as headache, blurry vision, chest pain, dizziness.    Diabetes mellitus II - at her annual medicare exam last month patient was told she had diabetes but stated 'I don't have diabetes'.  Today patient said that she was diagnosed with diabetes several years ago and was on metformin but has since lost almost 100 pounds and has not had to take medications in many years.  She tries to avoid sugary drinks, which she states taste 'too sweet' now.    Smoking: patient would like to quit smoking and brought in her nicorette gum which was recently purchased to make sure it was safe. She has quit twice before, once for 4 years and once for three years, but has been smoking for the past 3 years.    ROS: See HPI for pertinent positives and negatives  Past Medical History  Family history reviewed for today's visit. No changes.  Social history- patient is a current smoker  Objective: BP (!) 176/100   Pulse 64   Temp 98.3 F (36.8 C) (Oral)   Ht 5\' 3"  (1.6 m)   Wt 208 lb 12.8 oz (94.7 kg)   SpO2 99%   BMI 36.99 kg/m  Gen: NAD, alert and oriented, cooperative with exam HEENT: NCAT, EOMI, MMM Neck: FROM, supple, no masses CV: normal rate, regular rhythm. No murmurs, no rubs.  Resp: LCTAB, no wheezes, crackles. normal work of breathing GI: nontender to palpation, BS present, no guarding or organomegaly Msk: No edema, warm, normal tone, moves UE/LE spontaneously Skin: No rashes, no lesions Psych: Appropriate behavior  Assessment/Plan: HYPERTENSION, BENIGN SYSTEMIC Patient was 176/100 today.  She is asymptomatic.  Cozaar was was started and bmp was ordered.  Told patient to follow up in 1 to 2  weeks with a BP check.    Dyslipidemia Patient has been prescribed lipitor. One year ago ldl was 103.  Lipid panel ordered today.   Diabetes type 2, controlled (HCC) Patient currently has not been taking medication for DM for several years and has had A1c < 6.5% during this time.  A1c ordered today.   TOBACCO USER Patient came to clinic with box of nicorette gum asking if it was safe to take. Confirmed with patient that is was safe to use and advised her on how to use it.      Frederic Jericho, MD PGY-1

## 2018-06-01 NOTE — Patient Instructions (Addendum)
It was nice to meet you today.  I have added a medicine called losartan to help with your blood pressure.  Please take it once a day as directed.  I also want to get your cholesterol checked and to measure your potassium because of the medication you are on.  Please follow up in one week with a nurse appointment to check your Blood pressure.  You can take your nicorette gum as directed to help you quit smoking.    Have a nice day,   Frederic Jericho, MD

## 2018-06-01 NOTE — Assessment & Plan Note (Signed)
Patient currently has not been taking medication for DM for several years and has had A1c < 6.5% during this time.  A1c ordered today.

## 2018-06-01 NOTE — Assessment & Plan Note (Signed)
Patient came to clinic with box of nicorette gum asking if it was safe to take. Confirmed with patient that is was safe to use and advised her on how to use it.

## 2018-06-01 NOTE — Assessment & Plan Note (Signed)
Patient was 176/100 today.  She is asymptomatic.  Cozaar was was started and bmp was ordered.  Told patient to follow up in 1 to 2 weeks with a BP check.

## 2018-06-01 NOTE — Assessment & Plan Note (Signed)
Patient has been prescribed lipitor. One year ago ldl was 103.  Lipid panel ordered today.

## 2018-06-02 LAB — BASIC METABOLIC PANEL
BUN/Creatinine Ratio: 14 (ref 12–28)
BUN: 10 mg/dL (ref 8–27)
CALCIUM: 9.8 mg/dL (ref 8.7–10.3)
CHLORIDE: 108 mmol/L — AB (ref 96–106)
CO2: 23 mmol/L (ref 20–29)
Creatinine, Ser: 0.71 mg/dL (ref 0.57–1.00)
GFR calc non Af Amer: 88 mL/min/{1.73_m2} (ref >59)
GFR, EST AFRICAN AMERICAN: 102 mL/min/{1.73_m2} (ref >59)
Glucose: 84 mg/dL (ref 65–99)
Potassium: 4.3 mmol/L (ref 3.5–5.2)
Sodium: 144 mmol/L (ref 134–144)

## 2018-06-02 LAB — LIPID PANEL
CHOL/HDL RATIO: 3.9 ratio (ref 0.0–4.4)
Cholesterol, Total: 205 mg/dL — ABNORMAL HIGH (ref 100–199)
HDL: 52 mg/dL (ref >39)
LDL Calculated: 129 mg/dL — ABNORMAL HIGH (ref 0–99)
TRIGLYCERIDES: 118 mg/dL (ref 0–149)
VLDL Cholesterol Cal: 24 mg/dL (ref 5–40)

## 2018-06-04 ENCOUNTER — Ambulatory Visit: Payer: Medicare Other | Admitting: Family Medicine

## 2018-06-04 ENCOUNTER — Encounter: Payer: Self-pay | Admitting: Family Medicine

## 2018-06-04 ENCOUNTER — Telehealth: Payer: Self-pay | Admitting: Family Medicine

## 2018-06-04 NOTE — Telephone Encounter (Signed)
Tried calling patient about her lipid panel results.  Left voicemail telling her to call back at the office number to speak about her results.  LDL was elevated from last time.  Patient reported not being fully compliant with lipitor.  Will send letter to patient with results explaining importance of taking her medication.

## 2018-12-25 DIAGNOSIS — I10 Essential (primary) hypertension: Secondary | ICD-10-CM | POA: Diagnosis not present

## 2018-12-25 DIAGNOSIS — H401131 Primary open-angle glaucoma, bilateral, mild stage: Secondary | ICD-10-CM | POA: Diagnosis not present

## 2019-01-14 ENCOUNTER — Emergency Department (HOSPITAL_COMMUNITY): Payer: Medicare Other

## 2019-01-14 ENCOUNTER — Emergency Department (HOSPITAL_COMMUNITY)
Admission: EM | Admit: 2019-01-14 | Discharge: 2019-01-14 | Disposition: A | Discharge status: Home / Self Care | Payer: Medicare Other | Attending: Emergency Medicine | Admitting: Emergency Medicine

## 2019-01-14 DIAGNOSIS — Z79899 Other long term (current) drug therapy: Secondary | ICD-10-CM | POA: Diagnosis not present

## 2019-01-14 DIAGNOSIS — R51 Headache: Secondary | ICD-10-CM | POA: Insufficient documentation

## 2019-01-14 DIAGNOSIS — M549 Dorsalgia, unspecified: Secondary | ICD-10-CM | POA: Diagnosis not present

## 2019-01-14 DIAGNOSIS — Y9259 Other trade areas as the place of occurrence of the external cause: Secondary | ICD-10-CM | POA: Diagnosis not present

## 2019-01-14 DIAGNOSIS — Y939 Activity, unspecified: Secondary | ICD-10-CM | POA: Insufficient documentation

## 2019-01-14 DIAGNOSIS — Y999 Unspecified external cause status: Secondary | ICD-10-CM | POA: Insufficient documentation

## 2019-01-14 DIAGNOSIS — M545 Low back pain: Secondary | ICD-10-CM | POA: Insufficient documentation

## 2019-01-14 DIAGNOSIS — I509 Heart failure, unspecified: Secondary | ICD-10-CM | POA: Diagnosis not present

## 2019-01-14 DIAGNOSIS — W101XXA Fall (on)(from) sidewalk curb, initial encounter: Secondary | ICD-10-CM | POA: Insufficient documentation

## 2019-01-14 DIAGNOSIS — H9221 Otorrhagia, right ear: Secondary | ICD-10-CM | POA: Diagnosis not present

## 2019-01-14 DIAGNOSIS — F17210 Nicotine dependence, cigarettes, uncomplicated: Secondary | ICD-10-CM | POA: Insufficient documentation

## 2019-01-14 DIAGNOSIS — E119 Type 2 diabetes mellitus without complications: Secondary | ICD-10-CM | POA: Insufficient documentation

## 2019-01-14 DIAGNOSIS — W19XXXA Unspecified fall, initial encounter: Secondary | ICD-10-CM

## 2019-01-14 DIAGNOSIS — M542 Cervicalgia: Secondary | ICD-10-CM | POA: Diagnosis not present

## 2019-01-14 MED ORDER — METOCLOPRAMIDE HCL 5 MG/ML IJ SOLN: 5.0000 mg | Freq: Once | INTRAMUSCULAR | Status: DC

## 2019-01-14 MED ORDER — LIDOCAINE 5 % EX PTCH: 1.0000 | MEDICATED_PATCH | CUTANEOUS | 0 refills | Status: AC

## 2019-01-14 MED ORDER — DEXAMETHASONE SODIUM PHOSPHATE 4 MG/ML IJ SOLN: 4.0000 mg | Freq: Once | INTRAMUSCULAR | Status: DC

## 2019-01-14 MED ORDER — DIPHENHYDRAMINE HCL 50 MG/ML IJ SOLN: 12.5000 mg | Freq: Once | INTRAMUSCULAR | Status: DC

## 2019-01-14 NOTE — ED Triage Notes (Signed)
Pt reports slip and fall on concrete. States she hit her head. Denies LOC or thinners. Reports bloody drainage to right ear after fall and headache. Pt awake, alert, oriented x4.

## 2019-01-14 NOTE — Discharge Instructions (Addendum)
You have been seen today for fall. Please read and follow all provided instructions. Return to the emergency room for worsening condition or new concerning symptoms.    1. Medications:  You can take tylenol for pain as needed. Please take as prescribed. Continue usual home medications. -Prescription sent to your pharmacy for Lidoderm patches.  You can apply this to your back for pain if needed.  Please use as prescribed. Take medications as prescribed. Please review all of the medicines and only take them if you do not have an allergy to them.   2. Treatment: rest, drink plenty of fluids  3. Follow Up: Please follow up with your primary doctor in 2-5 days for discussion of your diagnoses and further evaluation after today's visit; Call today to arrange your follow up.   ?    Sebastian IF:  - There is confusion or drowsiness (although children frequently become drowsy after injury). You cannot awaken the injuredperson.   - There is nausea (feeling sick to your stomach) or continued, forceful vomiting. You notice dizziness or unsteadiness which is getting worse, or inability to walk.   - You have convulsions or unconsciousness.   - You experience severe, persistent headaches not relieved by Tylenol. (Do not take aspirin as this impairs clotting abilities). Take other pain medications only as directed.   - You cannot use arms or legs normally.   - There are changes in pupil sizes. (This is the black center in the colored part of the eye)   - There is clear or bloody discharge from the nose or ears.   - Change in speech, vision, swallowing, or understanding.   - Localized weakness, numbness, tingling, or change in bowel or bladder control.

## 2019-01-14 NOTE — ED Notes (Signed)
Patient transported to CT 

## 2019-01-14 NOTE — ED Provider Notes (Signed)
[facility] EMERGENCY DEPARTMENT Provider Note   CSN: [id number] Arrival date & time: 01/14/19  1001    History   Chief Complaint Chief Complaint  Patient presents with   Fall    HPI Alicia [name] is a 68 y.o. female presents emergency department today with chief complaint of fall x2 days ago.  Patient states she was at the [employer] when she tripped over the curb and landed on her back.  She states she hit the back of her head on concrete.  Since the fall she has continued to have headache, neck pain, and back pain. Reports pain has been constant, rating it 7/10 in severity. She denies loss of consciousness.  She has been taking Tylenol for headache with minimal relief.  She reports associated intermittent nausea without emesis. Not anticoagulated.  Denies fever, chills, blurry vision, chest pain, shortness of breath, numbness or weakness, saddle anesthesia.    Past Medical History:  Diagnosis Date   CHF (congestive heart failure) (HCC)    Depression    Diverticulosis    Esophageal reflux    Essential hypertension, benign    Glaucoma    Hyperlipidemia    Internal hemorrhoids    Obesity    Osteoarthritis    Schizophrenia (HCC)    Sleep apnea    Type II or unspecified type diabetes mellitus without mention of complication, not stated as uncontrolled     Patient Active Problem List   Diagnosis Date Noted   Diabetes type 2, controlled (HCC) 05/17/2016   Preventative health care 05/19/2015   Renal cyst 05/05/2012   GERD (gastroesophageal reflux disease) 10/24/2011   TOBACCO USER 06/18/2010   HOT FLASHES 05/29/2009   BACK PAIN, LUMBAR 03/07/2009   Dyslipidemia 10/06/2006   OBESITY, NOS 09/18/2006   SCHIZOPHRENIA 09/18/2006   Depression, controlled 09/18/2006   HYPERTENSION, BENIGN SYSTEMIC 09/18/2006   COR PULMONALE 09/18/2006   Osteoarthritis 09/18/2006   APNEA, SLEEP 09/18/2006    Past Surgical History:    Procedure Laterality Date   ABDOMINAL HYSTERECTOMY     CARPAL TUNNEL RELEASE     right   CATARACT EXTRACTION, BILATERAL     EYE SURGERY     LAPAROSCOPY     WRIST SURGERY     cyst removed     OB History   No obstetric history on file.      Home Medications    Prior to Admission medications   Medication Sig Start Date End Date Taking? Authorizing Provider  acetaminophen (TYLENOL) 325 MG tablet Take by mouth every 6 (six) hours as needed for mild pain or moderate pain.   Yes [provider]  atorvastatin (LIPITOR) 40 MG tablet Take 1 tablet (40 mg total) daily by mouth. Patient not taking: Reported on 05/05/2018 05/28/17   Dolores Patty C, DO  diclofenac sodium (VOLTAREN) 1 % GEL Apply 2 g topically 4 (four) times daily. Patient not taking: Reported on 05/05/2018 06/10/17   Tarry Kos, MD  hydrochlorothiazide (HYDRODIURIL) 12.5 MG tablet Take 1 tablet (12.5 mg total) by mouth daily. Patient not taking: Reported on 01/14/2019 06/01/18   Sandre Kitty, MD  lidocaine (LIDODERM) 5 % Place 1 patch onto the skin daily. Remove & Discard patch within 12 hours or as directed by MD 01/14/19   Terrilynn Postell, Yvonna Alanis E, PA-C  losartan (COZAAR) 50 MG tablet Take 1 tablet (50 mg total) by mouth daily. Patient not taking: Reported on 01/14/2019 06/01/18   Sandre Kitty, MD  Family History Family History  Problem Relation Age of Onset   Heart disease Mother    Diabetes Mother    Nephrolithiasis Mother    Diabetes Sister    Hypertension Sister    Alcohol abuse Brother    Diabetes Brother    Hypertension Brother    Heart disease Father        CHF   Diabetes Father    Hypertension Father    Nephrolithiasis Sister    Diabetes Sister    Hypertension Sister    Diabetes Sister    Hypertension Sister    Diabetes Sister    Hypertension Sister    Diabetes Sister    Hypertension Sister    Diabetes Sister    Hypertension Sister    Diabetes Brother     Hypertension Brother    Diabetes Brother    Hypertension Brother     Social History Social History   Tobacco Use   Smoking status: Current Every Day Smoker    Packs/day: 0.25    Types: Cigarettes   Smokeless tobacco: Never Used   Tobacco comment: slowly weaning self down  Substance Use Topics   Alcohol use: No    Alcohol/week: 0.0 standard drinks   Drug use: No     Allergies   Aspirin, Banana, Chocolate, Egg yolk, and Ibuprofen   Review of Systems Review of Systems  Constitutional: Negative for chills and fever.  HENT: Negative for congestion, ear discharge, ear pain, sinus pressure, sinus pain and sore throat.   Eyes: Negative for pain and redness.  Respiratory: Negative for cough and shortness of breath.   Cardiovascular: Negative for chest pain.  Gastrointestinal: Negative for abdominal pain, constipation, diarrhea, nausea and vomiting.  Genitourinary: Negative for dysuria and hematuria.  Musculoskeletal: Positive for back pain and neck pain.  Skin: Negative for wound.  Neurological: Positive for headaches. Negative for weakness and numbness.     Physical Exam Updated Vital Signs BP (!) 169/67 (BP Location: Right Arm)    Pulse 81    Temp 98.4 F (36.9 C) (Oral)    Resp 16    SpO2 100%   Physical Exam Vitals signs and nursing note reviewed.  Constitutional:      General: She is not in acute distress.    Appearance: She is not ill-appearing.  HENT:     Head: Normocephalic. No raccoon eyes or Battle's sign.     Jaw: There is normal jaw occlusion. No tenderness.      Right Ear: Tympanic membrane and external ear normal. No hemotympanum.     Left Ear: Tympanic membrane and external ear normal. No hemotympanum.     Nose: Nose normal. No nasal tenderness.     Mouth/Throat:     Mouth: Mucous membranes are moist.     Pharynx: Oropharynx is clear.  Eyes:     General: No scleral icterus.       Right eye: No discharge.        Left eye: No discharge.      Extraocular Movements: Extraocular movements intact.     Conjunctiva/sclera: Conjunctivae normal.     Pupils: Pupils are equal, round, and reactive to light.  Neck:     Musculoskeletal: Normal range of motion.     Vascular: No JVD.     Comments: Full ROM intact. Tenderness to spinous process. No bony stepoffs or deformities, no paraspinous muscle TTP or muscle spasms. No rigidity or meningeal signs. No bruising, erythema, or swelling.  Cardiovascular:  Rate and Rhythm: Normal rate and regular rhythm.     Pulses: Normal pulses.          Radial pulses are 2+ on the right side and 2+ on the left side.     Heart sounds: Normal heart sounds.  Pulmonary:     Comments: Lungs clear to auscultation in all fields. Symmetric chest rise. No wheezing, rales, or rhonchi. Abdominal:     Comments: Abdomen is soft, non-distended, and non-tender in all quadrants. No rigidity, no guarding. No peritoneal signs.  Musculoskeletal: Normal range of motion.       Arms:     Comments: Tender to palpation of lumbar spinous process and left paraspinal muscles of lumbar spine. Negative straight leg raise test bilaterally. No overlying erythema, ecchymosis, edema. No warmth.  Full ROM of thoracic and lumbar spine.  Skin:    General: Skin is warm and dry.     Capillary Refill: Capillary refill takes less than 2 seconds.  Neurological:     Mental Status: She is oriented to person, place, and time.     GCS: GCS eye subscore is 4. GCS verbal subscore is 5. GCS motor subscore is 6.     Comments: Fluent speech, no facial droop.  Psychiatric:        Behavior: Behavior normal.      ED Treatments / Results  Labs (all labs ordered are listed, but only abnormal results are displayed) Labs Reviewed - No data to display  EKG None  Radiology Dg Lumbar Spine Complete  Result Date: 01/14/2019 CLINICAL DATA:  Fall on Tuesday.  Lower back pain EXAM: LUMBAR SPINE - COMPLETE 4+ VIEW COMPARISON:  Abdominal CT  03/26/2012 FINDINGS: Lateral imaging is limited by rotation. There is no evident fracture or traumatic malalignment. Lucency through the L1 right transverse process is chronic based on 2013 abdominal CT. Bulky degenerative spurring in the lower lumbar spine. Disc heights are well preserved, especially for age. Atherosclerotic calcification. IMPRESSION: 1. No acute finding. 2. Advanced lower lumbar facet arthropathy. Electronically Signed   By: Marnee SpringJonathon  Watts M.D.   On: 01/14/2019 11:58   Ct Head Wo Contrast  Result Date: 01/14/2019 CLINICAL DATA:  68 year old female with a history of fall and right ear hemorrhage EXAM: CT HEAD WITHOUT CONTRAST CT CERVICAL SPINE WITHOUT CONTRAST TECHNIQUE: Multidetector CT imaging of the head and cervical spine was performed following the standard protocol without intravenous contrast. Multiplanar CT image reconstructions of the cervical spine were also generated. COMPARISON:  None. FINDINGS: CT HEAD FINDINGS Brain: No acute intracranial hemorrhage. No midline shift or mass effect. Gray-white differentiation maintained. Unremarkable appearance of the ventricular system. Vascular: Unremarkable. Skull: No acute fracture.  No aggressive bone lesion identified. Sinuses/Orbits: Unremarkable appearance of the orbits. Mastoid air cells clear. No middle ear effusion. No significant sinus disease. Other: No debris/fluid within the external auditory canal. No fluid or opacification of the middle ear. The right mastoid air cells are clear. No fracture identified. CT CERVICAL SPINE FINDINGS Alignment: Craniocervical junction aligned. Anatomic alignment of the cervical elements. No subluxation. Skull base and vertebrae: No acute fracture at the skullbase. Vertebral body heights relatively maintained. No acute fracture identified. Soft tissues and spinal canal: Unremarkable cervical soft tissues. Lymph nodes are present, though not enlarged. Disc levels: Mild disc space narrowing of the  cervical spine without significant uncovertebral joint disease, anterior osteophyte production. Minimal facet disease bilaterally. Upper chest: Mild paraseptal emphysema at the lung apices. Other: No canal hematoma. IMPRESSION: Head CT:  Negative for acute intracranial abnormality. Unremarkable appearance of the right external auditory meatus, middle ear, and right mastoid air cells. No fractures identified. Cervical CT: Negative for acute fracture or malalignment of the cervical spine. Electronically Signed   By: Gilmer MorJaime  Wagner D.O.   On: 01/14/2019 11:46   Ct Cervical Spine Wo Contrast  Result Date: 01/14/2019 CLINICAL DATA:  68 year old female with a history of fall and right ear hemorrhage EXAM: CT HEAD WITHOUT CONTRAST CT CERVICAL SPINE WITHOUT CONTRAST TECHNIQUE: Multidetector CT imaging of the head and cervical spine was performed following the standard protocol without intravenous contrast. Multiplanar CT image reconstructions of the cervical spine were also generated. COMPARISON:  None. FINDINGS: CT HEAD FINDINGS Brain: No acute intracranial hemorrhage. No midline shift or mass effect. Gray-white differentiation maintained. Unremarkable appearance of the ventricular system. Vascular: Unremarkable. Skull: No acute fracture.  No aggressive bone lesion identified. Sinuses/Orbits: Unremarkable appearance of the orbits. Mastoid air cells clear. No middle ear effusion. No significant sinus disease. Other: No debris/fluid within the external auditory canal. No fluid or opacification of the middle ear. The right mastoid air cells are clear. No fracture identified. CT CERVICAL SPINE FINDINGS Alignment: Craniocervical junction aligned. Anatomic alignment of the cervical elements. No subluxation. Skull base and vertebrae: No acute fracture at the skullbase. Vertebral body heights relatively maintained. No acute fracture identified. Soft tissues and spinal canal: Unremarkable cervical soft tissues. Lymph nodes are  present, though not enlarged. Disc levels: Mild disc space narrowing of the cervical spine without significant uncovertebral joint disease, anterior osteophyte production. Minimal facet disease bilaterally. Upper chest: Mild paraseptal emphysema at the lung apices. Other: No canal hematoma. IMPRESSION: Head CT: Negative for acute intracranial abnormality. Unremarkable appearance of the right external auditory meatus, middle ear, and right mastoid air cells. No fractures identified. Cervical CT: Negative for acute fracture or malalignment of the cervical spine. Electronically Signed   By: Gilmer MorJaime  Wagner D.O.   On: 01/14/2019 11:46    Procedures Procedures (including critical care time)  Medications Ordered in ED Medications - No data to display   Initial Impression / Assessment and Plan / ED Course  I have reviewed the triage vital signs and the nursing notes.  Pertinent labs & imaging results that were available during my care of the patient were reviewed by me and considered in my medical decision making (see chart for details).   68 yo female presents with headache and back pain after a fall. DDx includes SAH, skull fracture, migraine, tension headache, lumbar strain, fracture. She is well appearing, in no acute distress. Headache feels similar to ones in the past, but has lasted longer than usually does. Neuro exam without focal deficit. She ambulates with steady gait. No red flags for back pain, cauda equina very unlikely. She declines pain medication and headache cocktail while in the ED.CT head and neck negative for bleeding, fractures. Xray lumbar spine viewed by me also without signs of fracture or malalignment. Will discharge home with prescription for Lidoderm patches for back pain.  Evaluation does not show pathology that would require ongoing emergent intervention or inpatient treatment. I explained the diagnosis to the patient. Pt has no complaints prior to discharge. Patient is  comfortable with above plan and is stable for discharge at this time. All questions were answered prior to disposition.  Strict return precautions for returning to the ED were discussed. Encouraged follow up with PCP. The patient was discussed with and seen by Dr. Adela LankFloyd who agrees with the  treatment plan.      This note was prepared using Dragon voice recognition software and may include unintentional dictation errors due to the inherent limitations of voice recognition software.   Final Clinical Impressions(s) / ED Diagnoses   Final diagnoses:  Fall, initial encounter    ED Discharge Orders         Ordered    lidocaine (LIDODERM) 5 %  Every 24 hours     01/14/19 1239           Sherene Sireslbrizze, Shuntia Exton E, PA-C 01/14/19 1852    Melene PlanFloyd, Dan, DO 01/16/19 [phone]

## 2019-01-14 NOTE — ED Notes (Signed)
Patient verbalizes understanding of discharge instructions. Opportunity for questioning and answers were provided. Armband removed by staff, pt discharged from ED.  

## 2019-04-29 ENCOUNTER — Ambulatory Visit (INDEPENDENT_AMBULATORY_CARE_PROVIDER_SITE_OTHER): Payer: Medicare Other | Admitting: Family Medicine

## 2019-04-29 ENCOUNTER — Encounter: Payer: Self-pay | Admitting: Family Medicine

## 2019-04-29 ENCOUNTER — Other Ambulatory Visit: Payer: Self-pay

## 2019-04-29 VITALS — BP 128/80 | HR 79 | Ht 63.0 in | Wt 209.0 lb

## 2019-04-29 DIAGNOSIS — F17200 Nicotine dependence, unspecified, uncomplicated: Secondary | ICD-10-CM

## 2019-04-29 DIAGNOSIS — E785 Hyperlipidemia, unspecified: Secondary | ICD-10-CM | POA: Diagnosis not present

## 2019-04-29 DIAGNOSIS — E119 Type 2 diabetes mellitus without complications: Secondary | ICD-10-CM

## 2019-04-29 DIAGNOSIS — Z23 Encounter for immunization: Secondary | ICD-10-CM | POA: Diagnosis not present

## 2019-04-29 DIAGNOSIS — G4730 Sleep apnea, unspecified: Secondary | ICD-10-CM

## 2019-04-29 DIAGNOSIS — I10 Essential (primary) hypertension: Secondary | ICD-10-CM

## 2019-04-29 LAB — POCT GLYCOSYLATED HEMOGLOBIN (HGB A1C): HbA1c, POC (controlled diabetic range): 5.7 % (ref 0.0–7.0)

## 2019-04-29 NOTE — Assessment & Plan Note (Addendum)
Diet controlled.  A1c 5.7 today. Patient not interested in restarting metformin therapy.  Patient counseled on need to continue aspirin and statin therapy however she would not like to take her statin.  BMP, lipid panel obtained today.

## 2019-04-29 NOTE — Patient Instructions (Addendum)
Thank you for coming to see me today. It was a pleasure! Today we talked about:   Please call me in 1 month to let me know what your blood pressure is.   Please follow-up with me in 1 year or sooner as needed.  If you have any questions or concerns, please do not hesitate to call the office at [phone].  Take Care,   Alicia Shyia Fillingim, DO  Shoulder Exercises Ask your health care provider which exercises are safe for you. Do exercises exactly as told by your health care provider and adjust them as directed. It is normal to feel mild stretching, pulling, tightness, or discomfort as you do these exercises. Stop right away if you feel sudden pain or your pain gets worse. Do not begin these exercises until told by your health care provider. Stretching exercises External rotation and abduction This exercise is sometimes called corner stretch. This exercise rotates your arm outward (external rotation) and moves your arm out from your body (abduction). 1. Stand in a doorway with one of your feet slightly in front of the other. This is called a staggered stance. If you cannot reach your forearms to the door frame, stand facing a corner of a room. 2. Choose one of the following positions as told by your health care provider: ? Place your hands and forearms on the door frame above your head. ? Place your hands and forearms on the door frame at the height of your head. ? Place your hands on the door frame at the height of your elbows. 3. Slowly move your weight onto your front foot until you feel a stretch across your chest and in the front of your shoulders. Keep your head and chest upright and keep your abdominal muscles tight. 4. Hold for __________ seconds. 5. To release the stretch, shift your weight to your back foot. Repeat __________ times. Complete this exercise __________ times a day. Extension, standing 1. Stand and hold a broomstick, a cane, or a similar object behind your back. ? Your  hands should be a little wider than shoulder width apart. ? Your palms should face away from your back. 2. Keeping your elbows straight and your shoulder muscles relaxed, move the stick away from your body until you feel a stretch in your shoulders (extension). ? Avoid shrugging your shoulders while you move the stick. Keep your shoulder blades tucked down toward the middle of your back. 3. Hold for __________ seconds. 4. Slowly return to the starting position. Repeat __________ times. Complete this exercise __________ times a day. Range-of-motion exercises Pendulum  1. Stand near a wall or a surface that you can hold onto for balance. 2. Bend at the waist and let your left / right arm hang straight down. Use your other arm to support you. Keep your back straight and do not lock your knees. 3. Relax your left / right arm and shoulder muscles, and move your hips and your trunk so your left / right arm swings freely. Your arm should swing because of the motion of your body, not because you are using your arm or shoulder muscles. 4. Keep moving your hips and trunk so your arm swings in the following directions, as told by your health care provider: ? Side to side. ? Forward and backward. ? In clockwise and counterclockwise circles. 5. Continue each motion for __________ seconds, or for as long as told by your health care provider. 6. Slowly return to the starting position. Repeat  __________ times. Complete this exercise __________ times a day. Shoulder flexion, standing  1. Stand and hold a broomstick, a cane, or a similar object. Place your hands a little more than shoulder width apart on the object. Your left / right hand should be palm up, and your other hand should be palm down. 2. Keep your elbow straight and your shoulder muscles relaxed. Push the stick up with your healthy arm to raise your left / right arm in front of your body, and then over your head until you feel a stretch in your  shoulder (flexion). ? Avoid shrugging your shoulder while you raise your arm. Keep your shoulder blade tucked down toward the middle of your back. 3. Hold for __________ seconds. 4. Slowly return to the starting position. Repeat __________ times. Complete this exercise __________ times a day. Shoulder abduction, standing 1. Stand and hold a broomstick, a cane, or a similar object. Place your hands a little more than shoulder width apart on the object. Your left / right hand should be palm up, and your other hand should be palm down. 2. Keep your elbow straight and your shoulder muscles relaxed. Push the object across your body toward your left / right side. Raise your left / right arm to the side of your body (abduction) until you feel a stretch in your shoulder. ? Do not raise your arm above shoulder height unless your health care provider tells you to do that. ? If directed, raise your arm over your head. ? Avoid shrugging your shoulder while you raise your arm. Keep your shoulder blade tucked down toward the middle of your back. 3. Hold for __________ seconds. 4. Slowly return to the starting position. Repeat __________ times. Complete this exercise __________ times a day. Internal rotation  1. Place your left / right hand behind your back, palm up. 2. Use your other hand to dangle an exercise band, a towel, or a similar object over your shoulder. Grasp the band with your left / right hand so you are holding on to both ends. 3. Gently pull up on the band until you feel a stretch in the front of your left / right shoulder. The movement of your arm toward the center of your body is called internal rotation. ? Avoid shrugging your shoulder while you raise your arm. Keep your shoulder blade tucked down toward the middle of your back. 4. Hold for __________ seconds. 5. Release the stretch by letting go of the band and lowering your hands. Repeat __________ times. Complete this exercise __________  times a day. Strengthening exercises External rotation  1. Sit in a stable chair without armrests. 2. Secure an exercise band to a stable object at elbow height on your left / right side. 3. Place a soft object, such as a folded towel or a small pillow, between your left / right upper arm and your body to move your elbow about 4 inches (10 cm) away from your side. 4. Hold the end of the exercise band so it is tight and there is no slack. 5. Keeping your elbow pressed against the soft object, slowly move your forearm out, away from your abdomen (external rotation). Keep your body steady so only your forearm moves. 6. Hold for __________ seconds. 7. Slowly return to the starting position. Repeat __________ times. Complete this exercise __________ times a day. Shoulder abduction  1. Sit in a stable chair without armrests, or stand up. 2. Hold a __________ weight in your  left / right hand, or hold an exercise band with both hands. 3. Start with your arms straight down and your left / right palm facing in, toward your body. 4. Slowly lift your left / right hand out to your side (abduction). Do not lift your hand above shoulder height unless your health care provider tells you that this is safe. ? Keep your arms straight. ? Avoid shrugging your shoulder while you do this movement. Keep your shoulder blade tucked down toward the middle of your back. 5. Hold for __________ seconds. 6. Slowly lower your arm, and return to the starting position. Repeat __________ times. Complete this exercise __________ times a day. Shoulder extension 1. Sit in a stable chair without armrests, or stand up. 2. Secure an exercise band to a stable object in front of you so it is at shoulder height. 3. Hold one end of the exercise band in each hand. Your palms should face each other. 4. Straighten your elbows and lift your hands up to shoulder height. 5. Step back, away from the secured end of the exercise band, until  the band is tight and there is no slack. 6. Squeeze your shoulder blades together as you pull your hands down to the sides of your thighs (extension). Stop when your hands are straight down by your sides. Do not let your hands go behind your body. 7. Hold for __________ seconds. 8. Slowly return to the starting position. Repeat __________ times. Complete this exercise __________ times a day. Shoulder row 1. Sit in a stable chair without armrests, or stand up. 2. Secure an exercise band to a stable object in front of you so it is at waist height. 3. Hold one end of the exercise band in each hand. Position your palms so that your thumbs are facing the ceiling (neutral position). 4. Bend each of your elbows to a 90-degree angle (right angle) and keep your upper arms at your sides. 5. Step back until the band is tight and there is no slack. 6. Slowly pull your elbows back behind you. 7. Hold for __________ seconds. 8. Slowly return to the starting position. Repeat __________ times. Complete this exercise __________ times a day. Shoulder press-ups  1. Sit in a stable chair that has armrests. Sit upright, with your feet flat on the floor. 2. Put your hands on the armrests so your elbows are bent and your fingers are pointing forward. Your hands should be about even with the sides of your body. 3. Push down on the armrests and use your arms to lift yourself off the chair. Straighten your elbows and lift yourself up as much as you comfortably can. ? Move your shoulder blades down, and avoid letting your shoulders move up toward your ears. ? Keep your feet on the ground. As you get stronger, your feet should support less of your body weight as you lift yourself up. 4. Hold for __________ seconds. 5. Slowly lower yourself back into the chair. Repeat __________ times. Complete this exercise __________ times a day. Wall push-ups  1. Stand so you are facing a stable wall. Your feet should be about one  arm-length away from the wall. 2. Lean forward and place your palms on the wall at shoulder height. 3. Keep your feet flat on the floor as you bend your elbows and lean forward toward the wall. 4. Hold for __________ seconds. 5. Straighten your elbows to push yourself back to the starting position. Repeat __________ times. Complete this  exercise __________ times a day. This information is not intended to replace advice given to you by your health care provider. Make sure you discuss any questions you have with your health care provider. Document Released: 05/22/2005 Document Revised: 10/30/2018 Document Reviewed: 08/07/2018 Elsevier Patient Education  2020 [employer].

## 2019-04-29 NOTE — Progress Notes (Signed)
Subjective:  Patient ID: Alicia [name]  DOB: August 01, 1950 MRN: [id number]  Alicia [name] is a 68 y.o. female with a PMH of diet-controlled T2 DM, HTN, h/o OSA, schizophrenia, OA, GERD, HLD, here today for hypertension and diet-controlled diabetes.   HPI:  Hypertension: - Medications: Deviously on HCTZ and losartan but has not been taking this medication - Checking BP at home: No - Denies any SOB, CP, vision changes, LE edema, medication SEs, or symptoms of hypotension - Diet: To have a low-sodium and low-carb diet - Exercise: Exercises regularly  Arthritis: Patient reports that she has previously been recommended to have surgery on her bilateral knees.  She states that she was previously getting injections in her knees as well as has had an injection in her left shoulder.  She is now complaining of pain more in her right shoulder and otherwise she is able to manage her pain with Tylenol.  She understands that the weight loss helped her greatly with her pain.  She reports she does not really want any medications or injections done today but if it gets bad enough and she will revisit this.  Diabetes Diet controlled.  Has been for years.  Pre-patient previously had weight loss of 100 pounds.  She does not monitor her blood sugars. On Aspirin, and is noncompliant with her statin ROS: denies dizziness, diaphoresis, LOC, polyuria, polydipsia  Tobacco use disorder Patient smoking about a quarter of a pack per day and is decreasing on her own.  Not interested in outside help at this time but will reach out when she is ready  Monitoring Labs and Parameters Last A1C:  Lab Results  Component Value Date   HGBA1C 5.7 04/29/2019   Last Lipid:     Component Value Date/Time   CHOL 195 04/29/2019 1007   HDL 50 04/29/2019 1007   LDLDIRECT 124 (H) 12/01/2012 1622   Last Bmet  Potassium  Date Value Ref Range Status  04/29/2019 4.2 3.5 - 5.2 mmol/L Final   Sodium  Date Value Ref  Range Status  04/29/2019 144 134 - 144 mmol/L Final   Creat  Date Value Ref Range Status  05/17/2015 0.54 0.50 - 0.99 mg/dL Final   Creatinine, Ser  Date Value Ref Range Status  04/29/2019 0.76 0.57 - 1.00 mg/dL Final     ROS: As mentioned in HPI  Family hx: cardiovascular and diabetes Social hx: Denies use of illicit drugs, alcohol use Smoking status reviewed  Patient Active Problem List   Diagnosis Date Noted  . Diabetes type 2, controlled (HCC) 05/17/2016  . GERD (gastroesophageal reflux disease) 10/24/2011  . TOBACCO USER 06/18/2010  . BACK PAIN, LUMBAR 03/07/2009  . Dyslipidemia 10/06/2006  . OBESITY, NOS 09/18/2006  . SCHIZOPHRENIA 09/18/2006  . Depression, controlled 09/18/2006  . HYPERTENSION, BENIGN SYSTEMIC 09/18/2006  . COR PULMONALE 09/18/2006  . Osteoarthritis 09/18/2006  . APNEA, SLEEP 09/18/2006     Objective:  BP 128/80   Pulse 79   Ht 5\' 3"  (1.6 m)   Wt 209 lb (94.8 kg)   SpO2 95%   BMI 37.02 kg/m   Vitals and nursing note reviewed  General: NAD, pleasant Pulm: normal effort, CTAB Cardiac: RRR, normal heart sounds, no m/r/g Extremities: no edema or cyanosis. WWP. Skin: warm and dry, no rashes noted Neuro: alert and oriented, no focal deficits Psych: normal affect, normal thought content  Assessment & Plan:   Diabetes type 2, controlled (HCC) Diet controlled.  A1c 5.7 today. Patient not interested  in restarting metformin therapy.  Patient counseled on need to continue aspirin and statin therapy however she would not like to take her statin.  BMP, lipid panel obtained today.  Dyslipidemia Patient counseled on importance of taking statin, however she would not like to take this.  We will call her back after lipid panel results in order to discuss continuing statin therapy.  HYPERTENSION, BENIGN SYSTEMIC Patient has not been taking her medications and BP in office today at 128/80.  Did discuss importance of patient taking losartan.  We will  need to obtain microalbumin at next office visit to determine if patient would benefit from that aspect.  Patient is to call me back in 2 weeks and we will see what her blood pressure is at that time to discuss restarting medication.  TOBACCO USER She continues to decrease the amount of cigarettes that she is of per day.  Would like to continue doing this on her own.   [place] Margarita Bobrowski, DO Family Medicine Resident PGY-3

## 2019-04-30 ENCOUNTER — Encounter: Payer: Self-pay | Admitting: Family Medicine

## 2019-04-30 LAB — BASIC METABOLIC PANEL
BUN/Creatinine Ratio: 9 — ABNORMAL LOW (ref 12–28)
BUN: 7 mg/dL — ABNORMAL LOW (ref 8–27)
CO2: 25 mmol/L (ref 20–29)
Calcium: 9.3 mg/dL (ref 8.7–10.3)
Chloride: 107 mmol/L — ABNORMAL HIGH (ref 96–106)
Creatinine, Ser: 0.76 mg/dL (ref 0.57–1.00)
GFR calc Af Amer: 93 mL/min/{1.73_m2} (ref >59)
GFR calc non Af Amer: 81 mL/min/{1.73_m2} (ref >59)
Glucose: 99 mg/dL (ref 65–99)
Potassium: 4.2 mmol/L (ref 3.5–5.2)
Sodium: 144 mmol/L (ref 134–144)

## 2019-04-30 LAB — LIPID PANEL
Chol/HDL Ratio: 3.9 ratio (ref 0.0–4.4)
Cholesterol, Total: 195 mg/dL (ref 100–199)
HDL: 50 mg/dL (ref >39)
LDL Chol Calc (NIH): 126 mg/dL — ABNORMAL HIGH (ref 0–99)
Triglycerides: 103 mg/dL (ref 0–149)
VLDL Cholesterol Cal: 19 mg/dL (ref 5–40)

## 2019-04-30 LAB — CBC
Hematocrit: 37.1 % (ref 34.0–46.6)
Hemoglobin: 12.3 g/dL (ref 11.1–15.9)
MCH: 27 pg (ref 26.6–33.0)
MCHC: 33.2 g/dL (ref 31.5–35.7)
MCV: 82 fL (ref 79–97)
Platelets: 246 10*3/uL (ref 150–450)
RBC: 4.55 x10E6/uL (ref 3.77–5.28)
RDW: 15.1 % (ref 11.7–15.4)
WBC: 7.2 10*3/uL (ref 3.4–10.8)

## 2019-04-30 NOTE — Assessment & Plan Note (Signed)
Patient has not been taking her medications and BP in office today at 128/80.  Did discuss importance of patient taking losartan.  We will need to obtain microalbumin at next office visit to determine if patient would benefit from that aspect.  Patient is to call me back in 2 weeks and we will see what her blood pressure is at that time to discuss restarting medication.

## 2019-04-30 NOTE — Assessment & Plan Note (Signed)
Patient counseled on importance of taking statin, however she would not like to take this.  We will call her back after lipid panel results in order to discuss continuing statin therapy.

## 2019-04-30 NOTE — Assessment & Plan Note (Signed)
She continues to decrease the amount of cigarettes that she is of per day.  Would like to continue doing this on her own.

## 2019-05-13 ENCOUNTER — Encounter: Payer: Self-pay | Admitting: Family Medicine

## 2019-11-09 ENCOUNTER — Other Ambulatory Visit: Payer: Self-pay | Admitting: Family Medicine

## 2019-11-09 DIAGNOSIS — Z1231 Encounter for screening mammogram for malignant neoplasm of breast: Secondary | ICD-10-CM

## 2019-12-01 ENCOUNTER — Ambulatory Visit
Admission: RE | Admit: 2019-12-01 | Discharge: 2019-12-01 | Disposition: A | Discharge status: Home / Self Care | Payer: Medicare Other | Source: Ambulatory Visit | Attending: Family Medicine | Admitting: Family Medicine

## 2019-12-01 ENCOUNTER — Other Ambulatory Visit: Payer: Self-pay

## 2019-12-01 DIAGNOSIS — Z1231 Encounter for screening mammogram for malignant neoplasm of breast: Secondary | ICD-10-CM

## 2019-12-09 ENCOUNTER — Emergency Department (HOSPITAL_COMMUNITY)
Admission: EM | Admit: 2019-12-09 | Discharge: 2019-12-09 | Disposition: A | Discharge status: Home / Self Care | Payer: Medicare Other | Attending: Emergency Medicine | Admitting: Emergency Medicine

## 2019-12-09 ENCOUNTER — Other Ambulatory Visit: Payer: Self-pay

## 2019-12-09 ENCOUNTER — Emergency Department (HOSPITAL_COMMUNITY): Payer: Medicare Other

## 2019-12-09 DIAGNOSIS — S20212A Contusion of left front wall of thorax, initial encounter: Secondary | ICD-10-CM | POA: Diagnosis not present

## 2019-12-09 DIAGNOSIS — I509 Heart failure, unspecified: Secondary | ICD-10-CM | POA: Diagnosis not present

## 2019-12-09 DIAGNOSIS — F17210 Nicotine dependence, cigarettes, uncomplicated: Secondary | ICD-10-CM | POA: Diagnosis not present

## 2019-12-09 DIAGNOSIS — I110 Hypertensive heart disease with heart failure: Secondary | ICD-10-CM | POA: Diagnosis not present

## 2019-12-09 DIAGNOSIS — W19XXXA Unspecified fall, initial encounter: Secondary | ICD-10-CM | POA: Diagnosis not present

## 2019-12-09 DIAGNOSIS — Y939 Activity, unspecified: Secondary | ICD-10-CM | POA: Diagnosis not present

## 2019-12-09 DIAGNOSIS — Y999 Unspecified external cause status: Secondary | ICD-10-CM | POA: Diagnosis not present

## 2019-12-09 DIAGNOSIS — S2020XA Contusion of thorax, unspecified, initial encounter: Secondary | ICD-10-CM | POA: Diagnosis not present

## 2019-12-09 DIAGNOSIS — E119 Type 2 diabetes mellitus without complications: Secondary | ICD-10-CM | POA: Diagnosis not present

## 2019-12-09 DIAGNOSIS — Y92009 Unspecified place in unspecified non-institutional (private) residence as the place of occurrence of the external cause: Secondary | ICD-10-CM | POA: Diagnosis not present

## 2019-12-09 DIAGNOSIS — S299XXA Unspecified injury of thorax, initial encounter: Secondary | ICD-10-CM | POA: Diagnosis not present

## 2019-12-09 NOTE — ED Notes (Signed)
Patient verbalizes understanding of discharge instructions. Opportunity for questioning and answers were provided. Armband removed by staff, pt discharged from ED to home via POV  

## 2019-12-09 NOTE — ED Triage Notes (Signed)
Pt here from home for evaluation of L rib cage and flank pain after mechanical fall on Sunday with no LOC. Denies dizziness prior to fall. Ambulatory.

## 2019-12-10 NOTE — ED Provider Notes (Signed)
[facility] EMERGENCY DEPARTMENT Provider Note   CSN: [id number] Arrival date & time: 12/09/19  1324     History Chief Complaint  Patient presents with  . Fall    Alicia [name] is a 69 y.o. female.  69yo F w/ PMH below who p/w fall and L chest wall pain. 4 days ago, pt had mechanical fall at home and struck her L chest wall while falling. No head injury or LOC.  Since then she has had persistent pain on her left lateral chest wall which is worse with palpation.  She denies any breathing problems, neck or back pain, numbness, or extremity injury.  No anticoagulant use.  The history is provided by the patient.  Fall       Past Medical History:  Diagnosis Date  . CHF (congestive heart failure) ([place])   . Depression   . Diverticulosis   . Esophageal reflux   . Essential hypertension, benign   . Glaucoma   . Hyperlipidemia   . Internal hemorrhoids   . Obesity   . Osteoarthritis   . Renal cyst 05/05/2012  . Schizophrenia ([place])   . Sleep apnea   . Type II or unspecified type diabetes mellitus without mention of complication, not stated as uncontrolled     Patient Active Problem List   Diagnosis Date Noted  . Diabetes type 2, controlled ([place]) 05/17/2016  . GERD (gastroesophageal reflux disease) 10/24/2011  . TOBACCO USER 06/18/2010  . BACK PAIN, LUMBAR 03/07/2009  . Dyslipidemia 10/06/2006  . OBESITY, NOS 09/18/2006  . SCHIZOPHRENIA 09/18/2006  . Depression, controlled 09/18/2006  . HYPERTENSION, BENIGN SYSTEMIC 09/18/2006  . COR PULMONALE 09/18/2006  . Osteoarthritis 09/18/2006  . Sleep apnea 09/18/2006    Past Surgical History:  Procedure Laterality Date  . ABDOMINAL HYSTERECTOMY    . CARPAL TUNNEL RELEASE     right  . CATARACT EXTRACTION, BILATERAL    . EYE SURGERY    . LAPAROSCOPY    . WRIST SURGERY     cyst removed     OB History   No obstetric history on file.     Family History  Problem Relation Age of Onset  . Heart  disease Mother   . Diabetes Mother   . Nephrolithiasis Mother   . Diabetes Sister   . Hypertension Sister   . Alcohol abuse Brother   . Diabetes Brother   . Hypertension Brother   . Heart disease Father        CHF  . Diabetes Father   . Hypertension Father   . Nephrolithiasis Sister   . Diabetes Sister   . Hypertension Sister   . Diabetes Sister   . Hypertension Sister   . Diabetes Sister   . Hypertension Sister   . Diabetes Sister   . Hypertension Sister   . Diabetes Sister   . Hypertension Sister   . Diabetes Brother   . Hypertension Brother   . Diabetes Brother   . Hypertension Brother     Social History   Tobacco Use  . Smoking status: Current Every Day Smoker    Packs/day: 0.25    Types: Cigarettes  . Smokeless tobacco: Never Used  . Tobacco comment: slowly weaning self down  Substance Use Topics  . Alcohol use: No    Alcohol/week: 0.0 standard drinks  . Drug use: No    Home Medications Prior to Admission medications   Medication Sig Start Date End Date Taking? Authorizing Provider  acetaminophen (  TYLENOL) 325 MG tablet Take by mouth every 6 (six) hours as needed for mild pain or moderate pain.    [provider]  atorvastatin (LIPITOR) 40 MG tablet Take 1 tablet (40 mg total) daily by mouth. Patient not taking: Reported on 05/05/2018 05/28/17   Dolores Patty C, DO  diclofenac sodium (VOLTAREN) 1 % GEL Apply 2 g topically 4 (four) times daily. Patient not taking: Reported on 05/05/2018 06/10/17   Tarry Kos, MD  lidocaine (LIDODERM) 5 % Place 1 patch onto the skin daily. Remove & Discard patch within 12 hours or as directed by MD 01/14/19   Albrizze, Caroleen Hamman, PA-C    Allergies    Aspirin, Banana, Chocolate, Egg yolk, Ibuprofen, and Percocet [oxycodone-acetaminophen]  Review of Systems   Review of Systems All other systems reviewed and are negative except that which was mentioned in HPI  Physical Exam Updated Vital Signs BP (!) 191/108  (BP Location: Right Arm)   Pulse 65   Temp 98.5 F (36.9 C) (Oral)   Resp 18   SpO2 100%   Physical Exam Vitals and nursing note reviewed.  Constitutional:      General: She is not in acute distress.    Appearance: She is well-developed.  HENT:     Head: Normocephalic and atraumatic.  Eyes:     Conjunctiva/sclera: Conjunctivae normal.  Cardiovascular:     Rate and Rhythm: Normal rate and regular rhythm.     Heart sounds: Normal heart sounds. No murmur.  Pulmonary:     Effort: Pulmonary effort is normal.     Breath sounds: Normal breath sounds.  Chest:     Chest wall: Tenderness (L lateral chest wall, no crepitus) present.  Abdominal:     General: There is no distension.     Palpations: Abdomen is soft.     Tenderness: There is no abdominal tenderness.  Musculoskeletal:        General: No tenderness or deformity.     Cervical back: Neck supple.  Skin:    General: Skin is warm and dry.  Neurological:     Mental Status: She is alert and oriented to person, place, and time.     Comments: Fluent speech  Psychiatric:        Judgment: Judgment normal.     ED Results / Procedures / Treatments   Labs (all labs ordered are listed, but only abnormal results are displayed) Labs Reviewed - No data to display  EKG None  Radiology DG Chest 2 View  Result Date: 12/09/2019 CLINICAL DATA:  Left rib pain after fall. EXAM: CHEST - 2 VIEW COMPARISON:  April 09, 2017. FINDINGS: The heart size and mediastinal contours are within normal limits. Both lungs are clear. No pneumothorax or pleural effusion is noted. The visualized skeletal structures are unremarkable. IMPRESSION: No active cardiopulmonary disease. Electronically Signed   By: Lupita Raider M.D.   On: 12/09/2019 14:38    Procedures Procedures (including critical care time)  Medications Ordered in ED Medications - No data to display  ED Course  I have reviewed the triage vital signs and the nursing  notes.  Pertinent imaging results that were available during my care of the patient were reviewed by me and considered in my medical decision making (see chart for details).    MDM Rules/Calculators/A&P                      Pt breathing comfortably on exam, 100%  on RA. CXR clear. I discussed the possibility of chest wall contusion vs occult rib fx. I offered CT chest if she was very concerned about rib fx but explained that treatment would be similar. She declined this study and feels comfortable managing sx at home. Return precautions reviewed.  Final Clinical Impression(s) / ED Diagnoses Final diagnoses:  Chest wall contusion, left, initial encounter    Rx / DC Orders ED Discharge Orders    None       Toni Hoffmeister, Ambrose [place], MD 12/10/19 [phone]

## 2020-01-07 IMAGING — CT CT HEAD WITHOUT CONTRAST
4 series · 15 of 47 positions shown, 17 images · non-contrast
Comparison: None.

CLINICAL DATA: 68-year-old female with a history of fall and right
ear hemorrhage

EXAM:
CT HEAD WITHOUT CONTRAST
CT CERVICAL SPINE WITHOUT CONTRAST
TECHNIQUE: Multidetector CT imaging of the head and cervical spine was
performed following the standard protocol without intravenous
contrast. Multiplanar CT image reconstructions of the cervical spine
were also generated.

[Series 3: head without · axial · non-contrast · 0.41mm/px · z∈[+1260,+1365]mm · 7 of 29 slices shown, 9 images]
[im 4/29  brain]
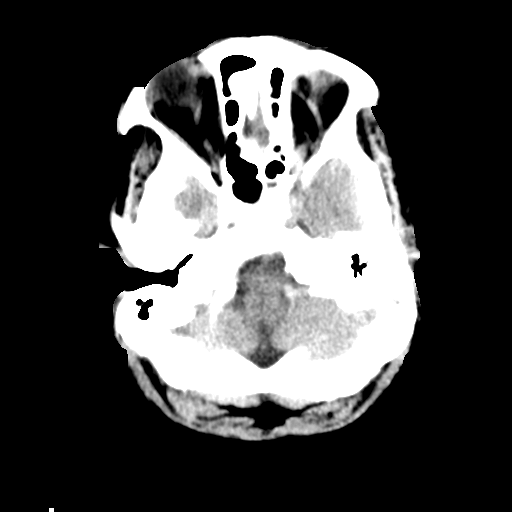
[im 4/29  bone]
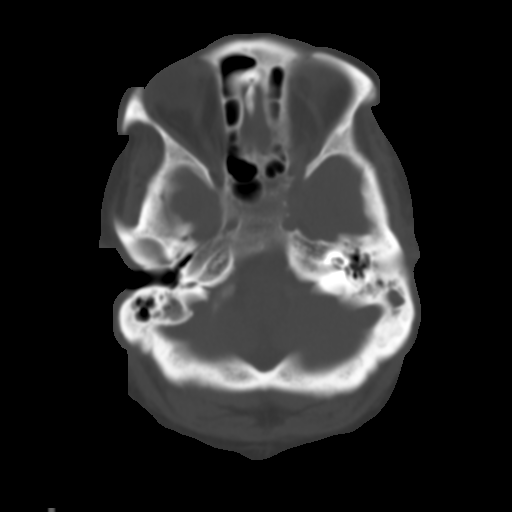
[im 8/29  brain]
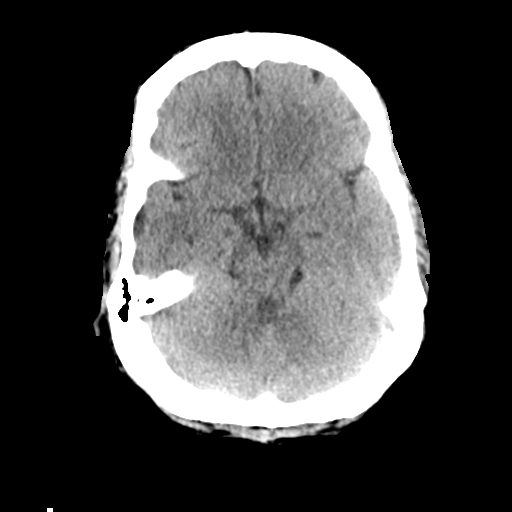
[im 11/29  brain]
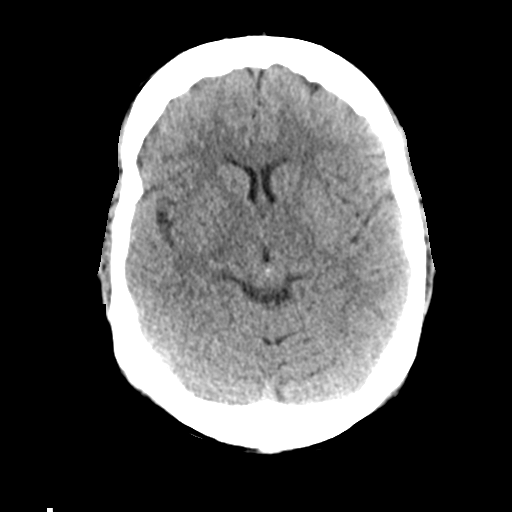
[im 15/29  brain]
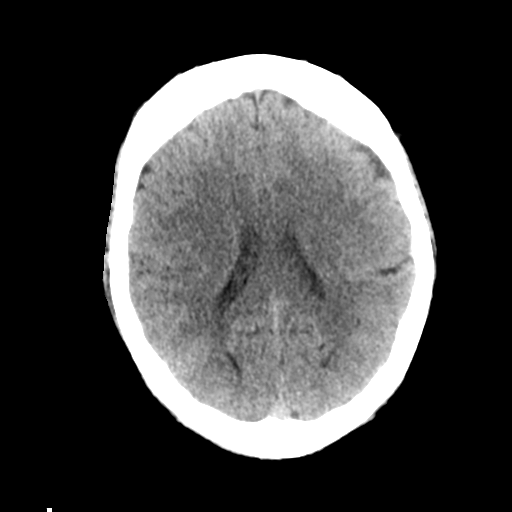
[im 18/29  brain]
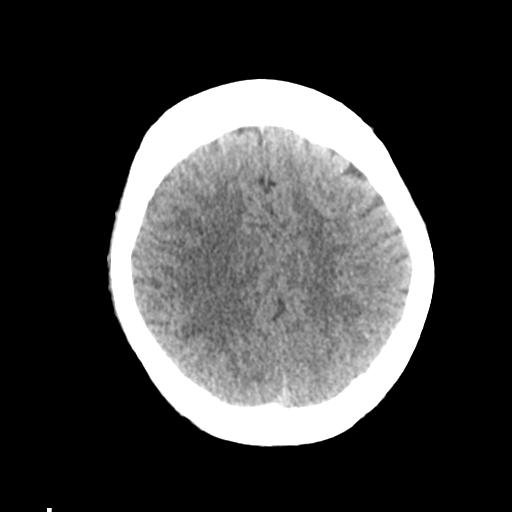
[im 18/29  bone]
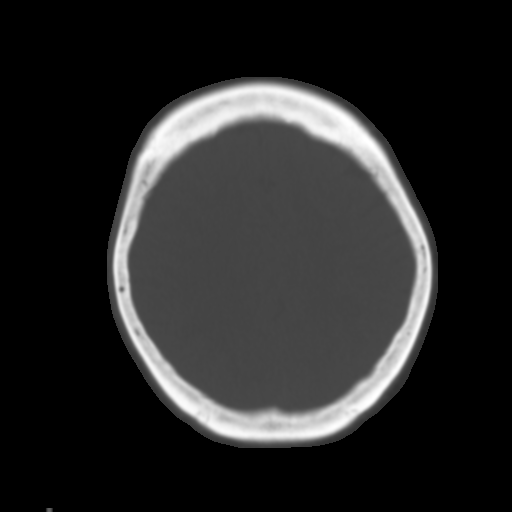
[im 22/29  brain]
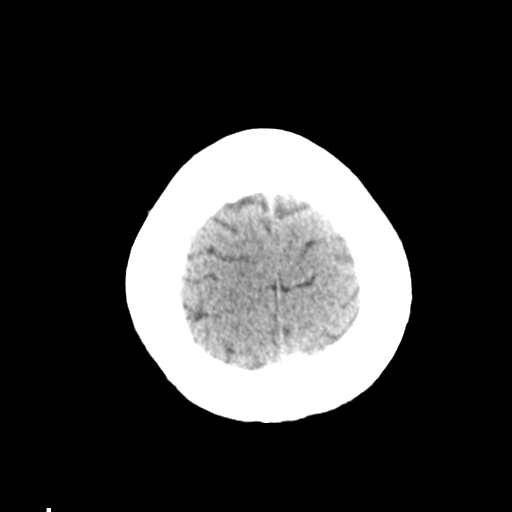
[im 25/29  brain]
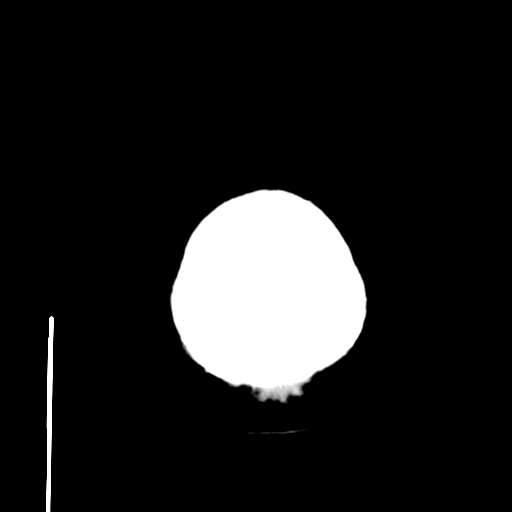

[Series 4: head bone · axial · 0.41mm/px · z∈[+1259,+1273]mm · 2 of 72 slices shown]
[im 8/72  bone]
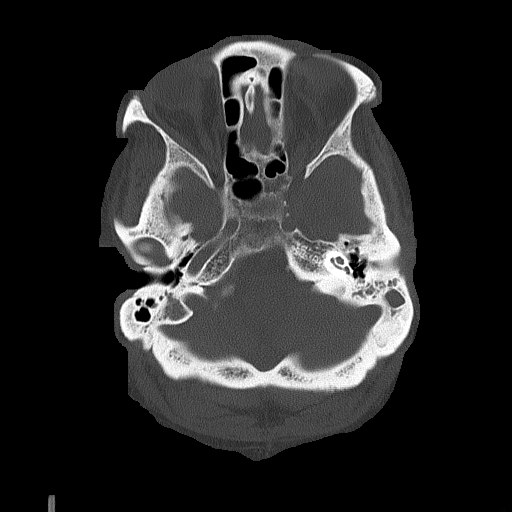
[im 15/72  bone]
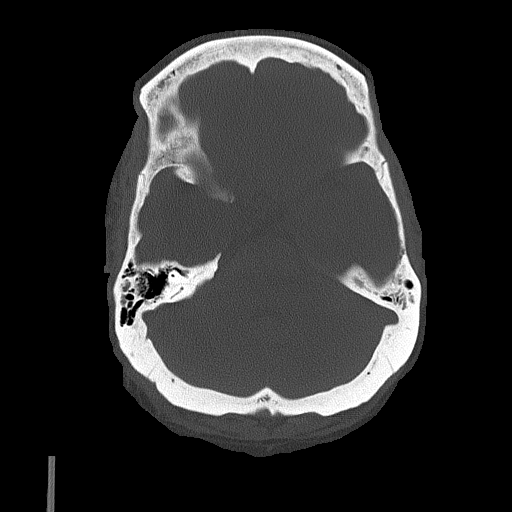

[Series 5: head without cor · coronal · non-contrast · 0.30mm/px · 3 of 67 slices shown]
[im 23/67  brain]
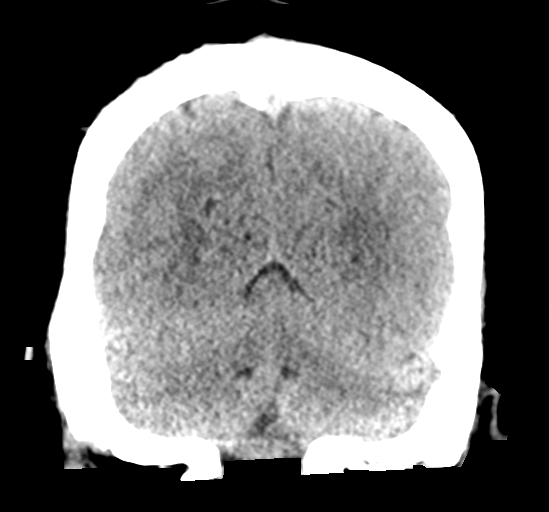
[im 30/67  brain]
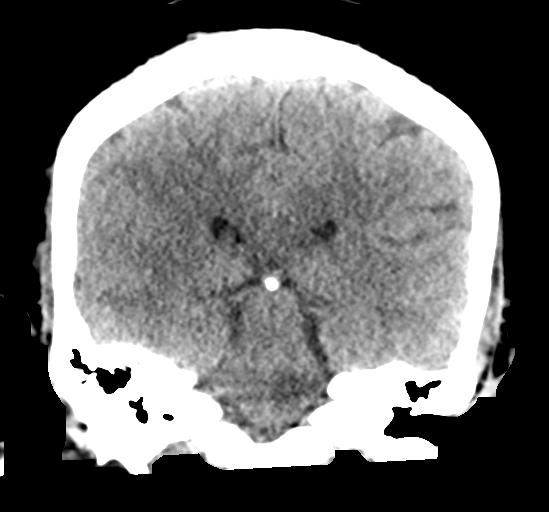
[im 37/67  brain]
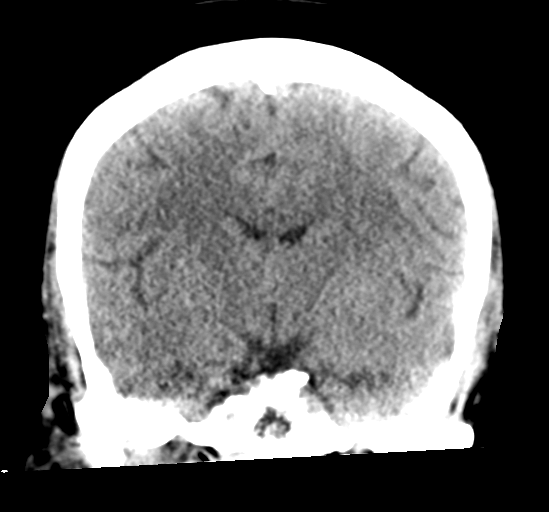

[Series 6: head without sag · sagittal · non-contrast · 0.29mm/px · 3 of 67 slices shown]
[im 23/67  brain]
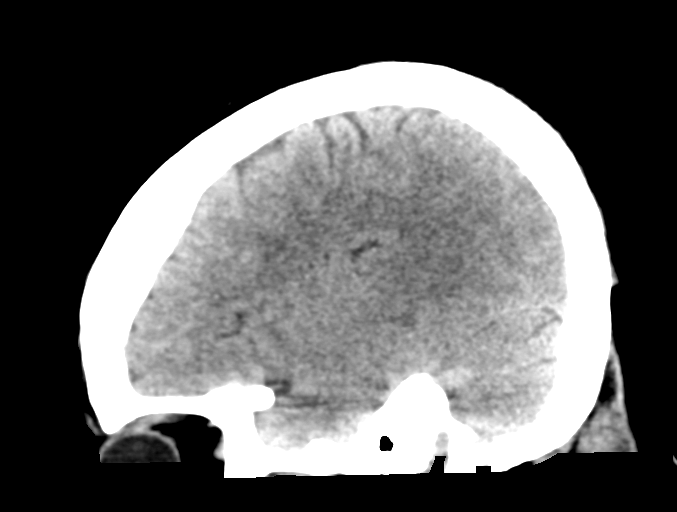
[im 34/67  brain]
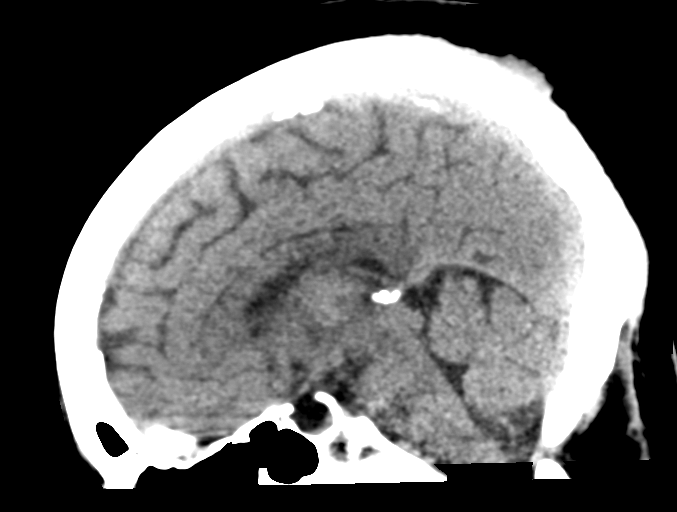
[im 45/67  brain]
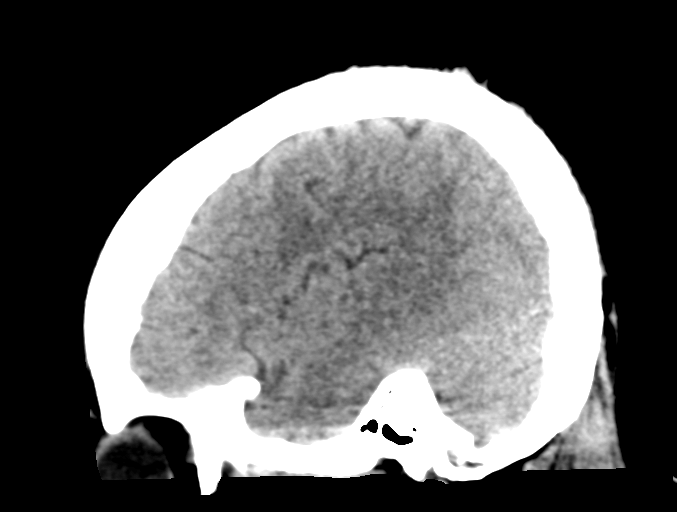

[15 of 47 positions shown; findings below may reference images not displayed]

FINDINGS: CT HEAD FINDINGS

Brain: No acute intracranial hemorrhage. No midline shift or mass
effect. Gray-white differentiation maintained. Unremarkable
appearance of the ventricular system.

Vascular: Unremarkable.

Skull: No acute fracture.  No aggressive bone lesion identified.

Sinuses/Orbits: Unremarkable appearance of the orbits. Mastoid air
cells clear. No middle ear effusion. No significant sinus disease.

Other: No debris/fluid within the external auditory canal. No fluid
or opacification of the middle ear. The right mastoid air cells are
clear. No fracture identified.

CT CERVICAL SPINE FINDINGS

Alignment: Craniocervical junction aligned. Anatomic alignment of
the cervical elements. No subluxation.

Skull base and vertebrae: No acute fracture at the skullbase.
Vertebral body heights relatively maintained. No acute fracture
identified.

Soft tissues and spinal canal: Unremarkable cervical soft tissues.
Lymph nodes are present, though not enlarged.

Disc levels: Mild disc space narrowing of the cervical spine without
significant uncovertebral joint disease, anterior osteophyte
production.

Minimal facet disease bilaterally.

Upper chest: Mild paraseptal emphysema at the lung apices.

Other: No canal hematoma.
IMPRESSION: Head CT:

Negative for acute intracranial abnormality.

Unremarkable appearance of the right external auditory meatus,
middle ear, and right mastoid air cells.

No fractures identified.

Cervical CT:

Negative for acute fracture or malalignment of the cervical spine.

## 2020-01-07 IMAGING — CT CT CERVICAL SPINE WITHOUT CONTRAST
3 of 4 series · 12 of 35 positions shown, 14 images · non-contrast
Comparison: None.

CLINICAL DATA: 68-year-old female with a history of fall and right
ear hemorrhage

EXAM:
CT HEAD WITHOUT CONTRAST
CT CERVICAL SPINE WITHOUT CONTRAST
TECHNIQUE: Multidetector CT imaging of the head and cervical spine was
performed following the standard protocol without intravenous
contrast. Multiplanar CT image reconstructions of the cervical spine
were also generated.

[Series 4: c_spine 2.0 st · axial · 0.27mm/px · z∈[+1105,+1229]mm · 4 of 94 slices shown, 5 images]
[im 16/94  soft-tissue]
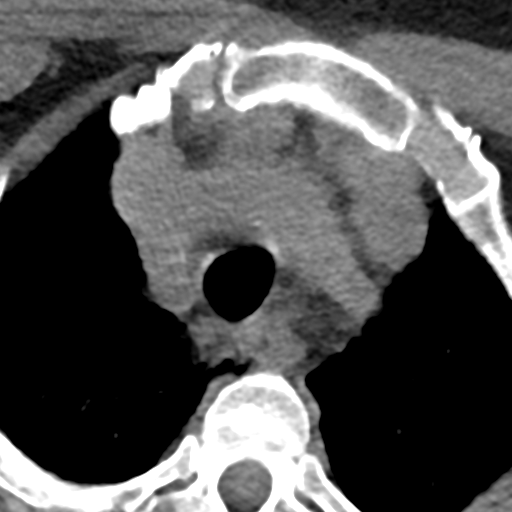
[im 16/94  bone]
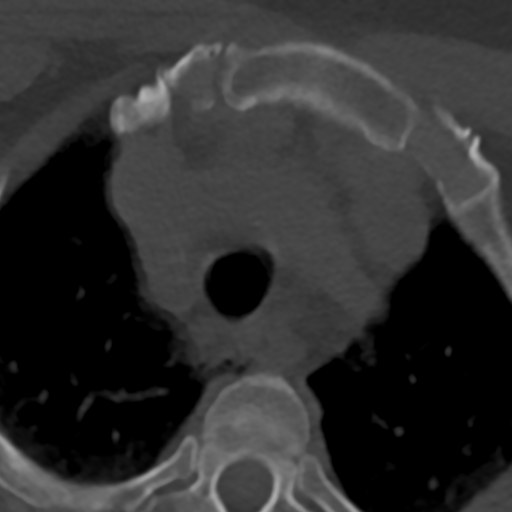
[im 32/94  bone]
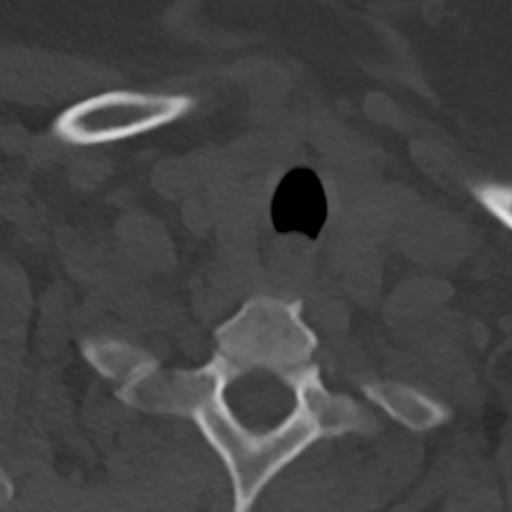
[im 63/94  bone]
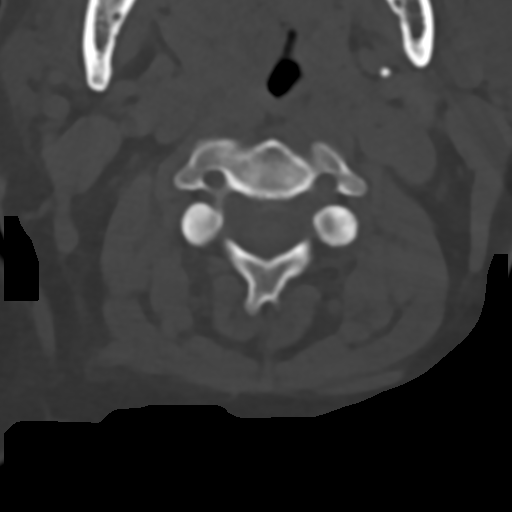
[im 78/94  bone]
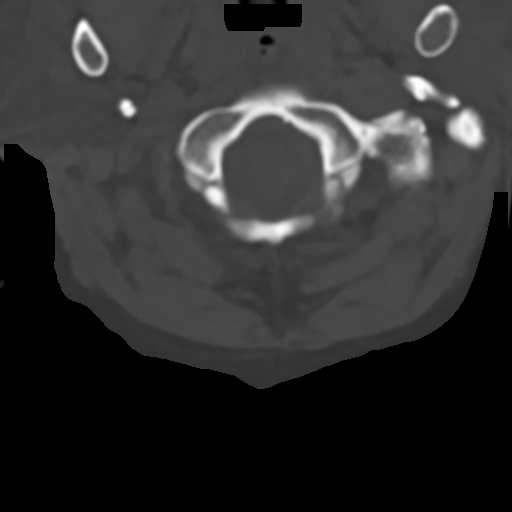

[Series 8: c_spine 2.0 sag bone · sagittal · 0.38mm/px · 5 of 61 slices shown, 6 images]
[im 21/61  bone]
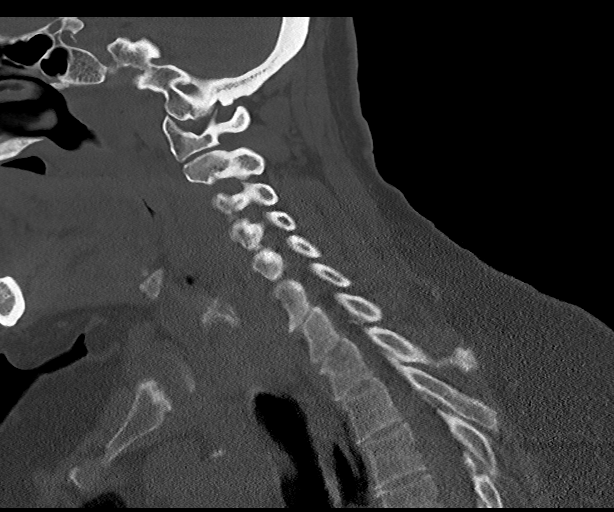
[im 26/61  bone]
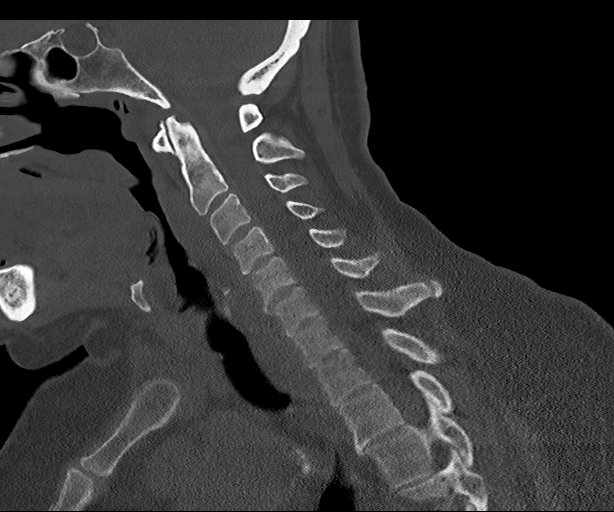
[im 31/61  soft-tissue]
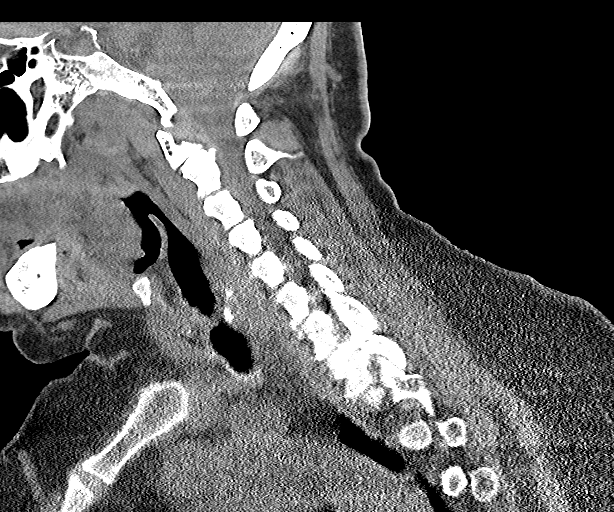
[im 31/61  bone]
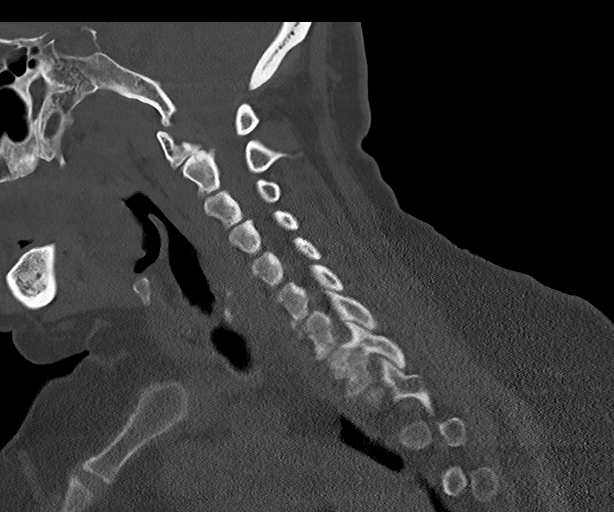
[im 36/61  bone]
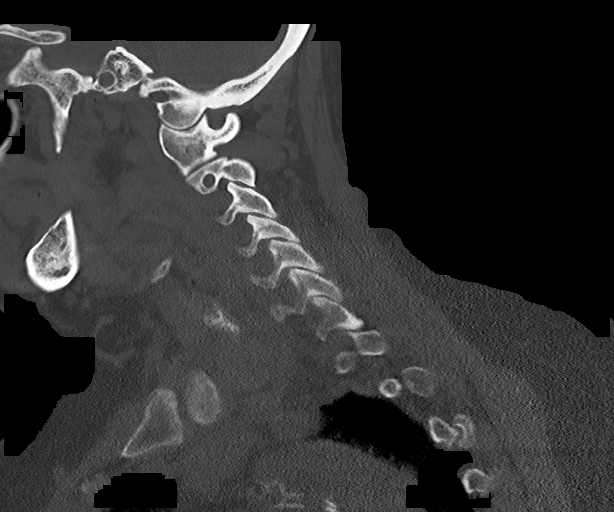
[im 41/61  bone]
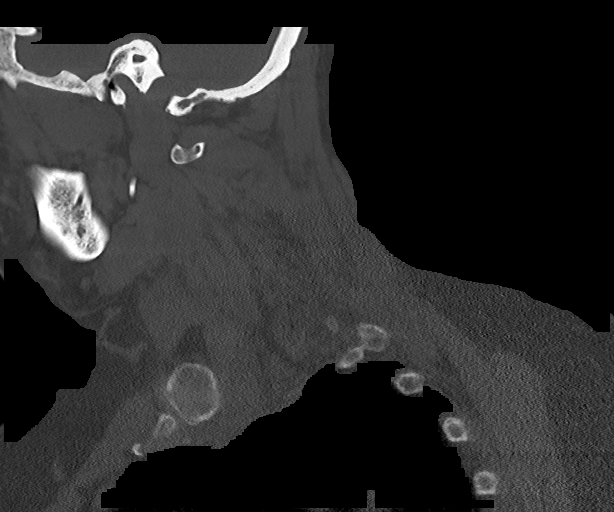

[Series 9: c_spine 2.0 cor bone · coronal · 0.25mm/px · 3 of 61 slices shown]
[im 17/61  bone]
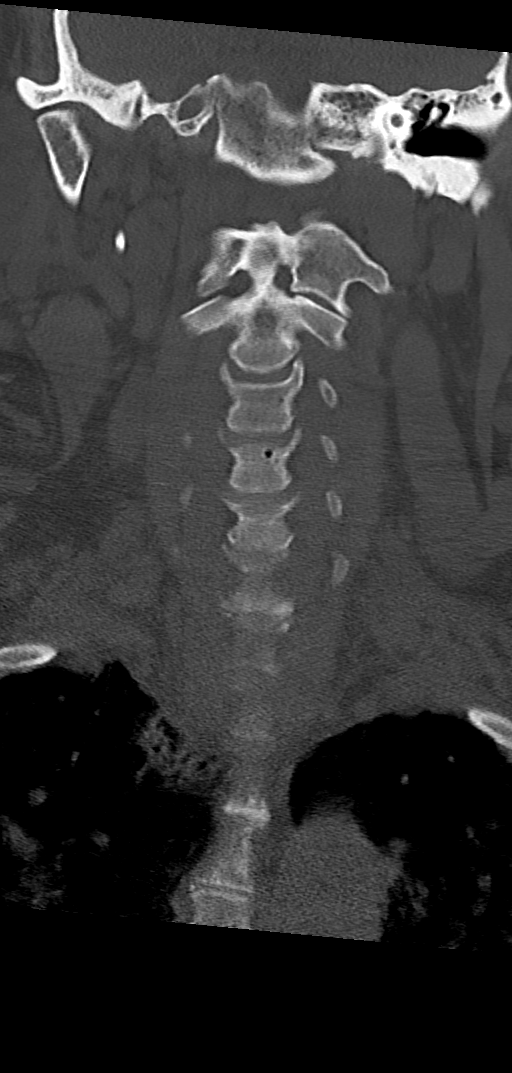
[im 26/61  bone]
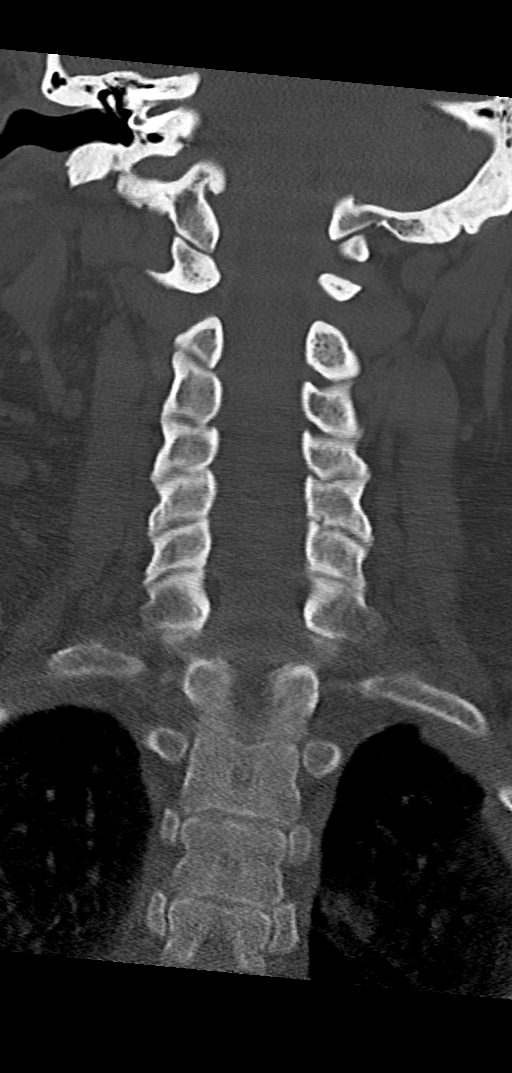
[im 35/61  bone]
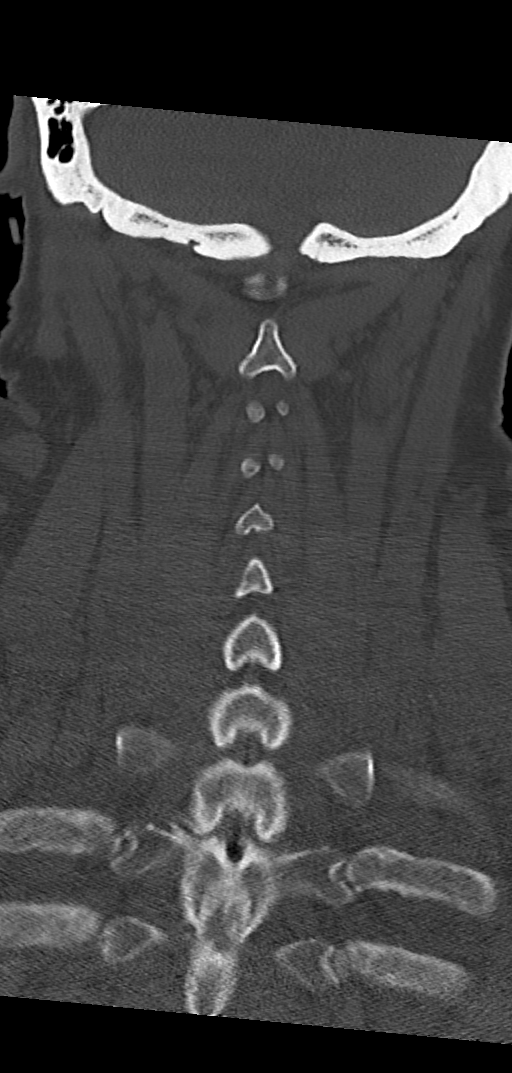

[12 of 35 positions shown; findings below may reference images not displayed]

FINDINGS: CT HEAD FINDINGS

Brain: No acute intracranial hemorrhage. No midline shift or mass
effect. Gray-white differentiation maintained. Unremarkable
appearance of the ventricular system.

Vascular: Unremarkable.

Skull: No acute fracture.  No aggressive bone lesion identified.

Sinuses/Orbits: Unremarkable appearance of the orbits. Mastoid air
cells clear. No middle ear effusion. No significant sinus disease.

Other: No debris/fluid within the external auditory canal. No fluid
or opacification of the middle ear. The right mastoid air cells are
clear. No fracture identified.

CT CERVICAL SPINE FINDINGS

Alignment: Craniocervical junction aligned. Anatomic alignment of
the cervical elements. No subluxation.

Skull base and vertebrae: No acute fracture at the skullbase.
Vertebral body heights relatively maintained. No acute fracture
identified.

Soft tissues and spinal canal: Unremarkable cervical soft tissues.
Lymph nodes are present, though not enlarged.

Disc levels: Mild disc space narrowing of the cervical spine without
significant uncovertebral joint disease, anterior osteophyte
production.

Minimal facet disease bilaterally.

Upper chest: Mild paraseptal emphysema at the lung apices.

Other: No canal hematoma.
IMPRESSION: Head CT:

Negative for acute intracranial abnormality.

Unremarkable appearance of the right external auditory meatus,
middle ear, and right mastoid air cells.

No fractures identified.

Cervical CT:

Negative for acute fracture or malalignment of the cervical spine.

## 2020-01-07 IMAGING — CR LUMBAR SPINE - COMPLETE 4+ VIEW
5 series · 5 of 5 positions shown · non-contrast
Comparison: Abdominal CT 03/26/2012

CLINICAL DATA: Fall on [REDACTED].  Lower back pain

EXAM:
LUMBAR SPINE - COMPLETE 4+ VIEW

[l-spine ap]
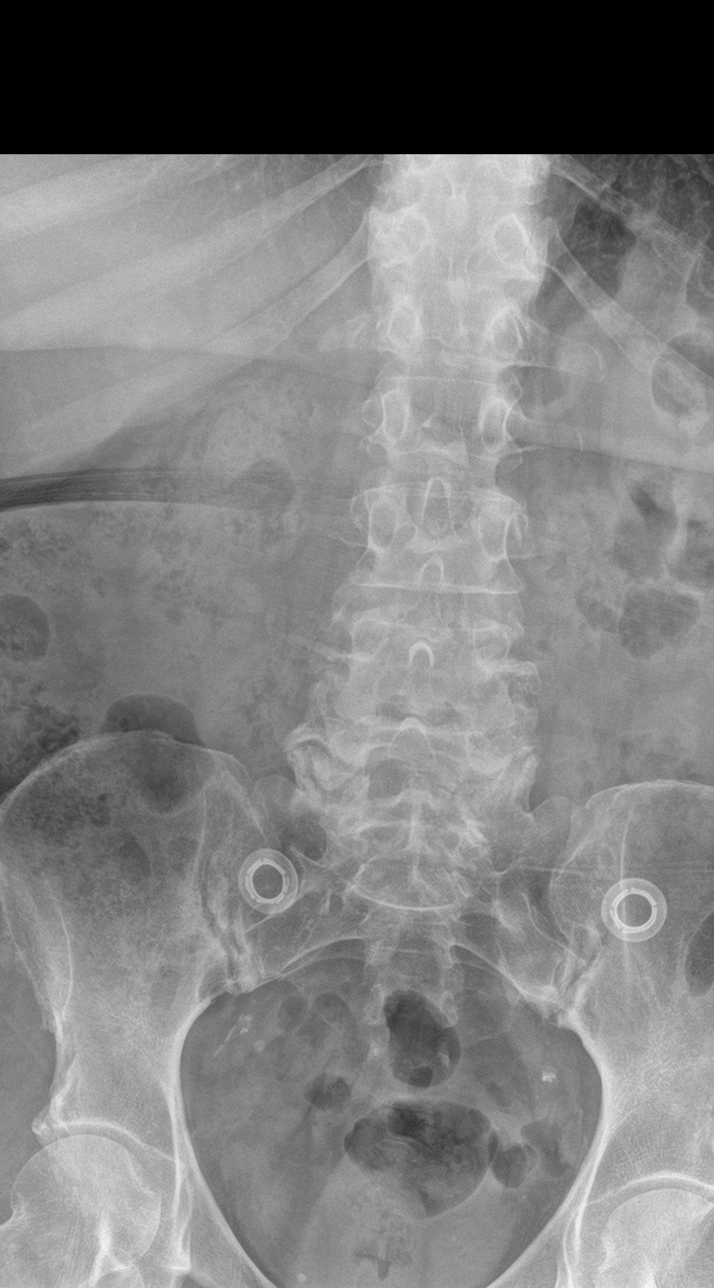

[l-spine obl (1 of 2)]
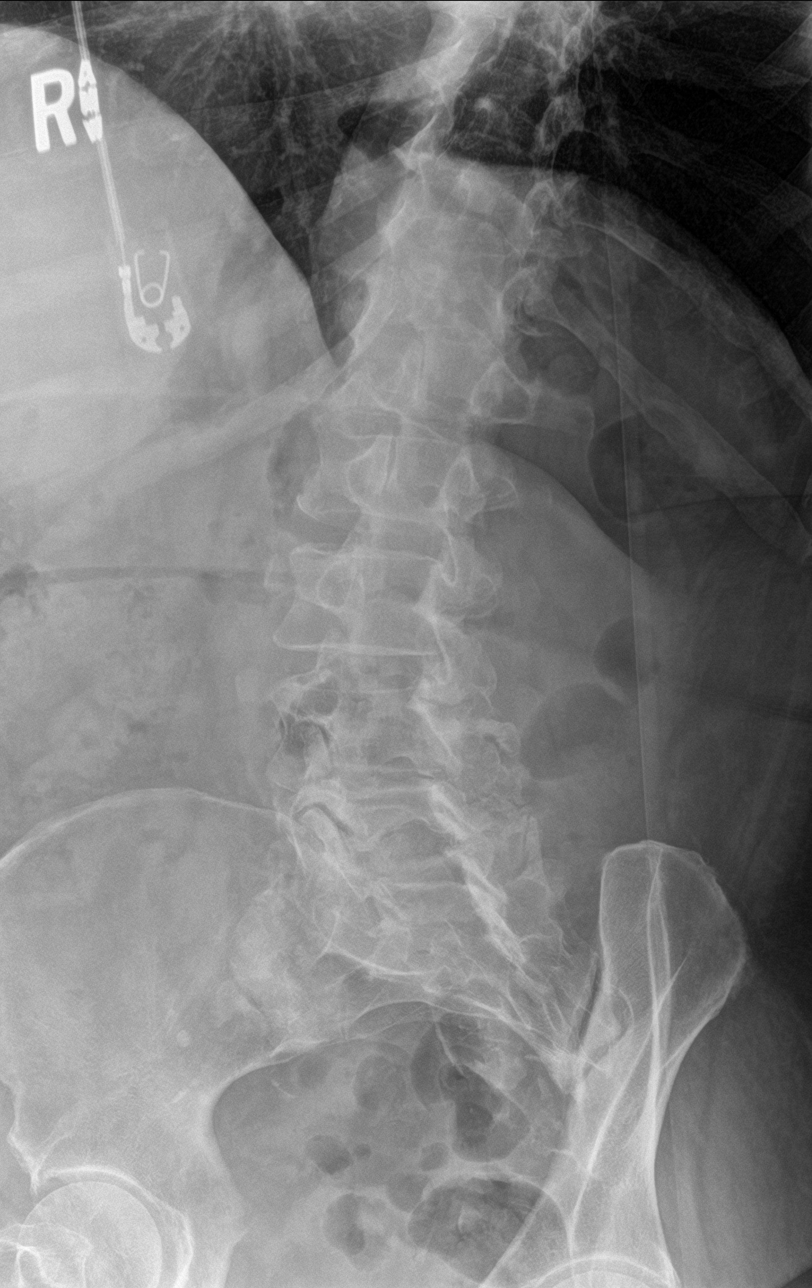

[l-spine obl (2 of 2)]
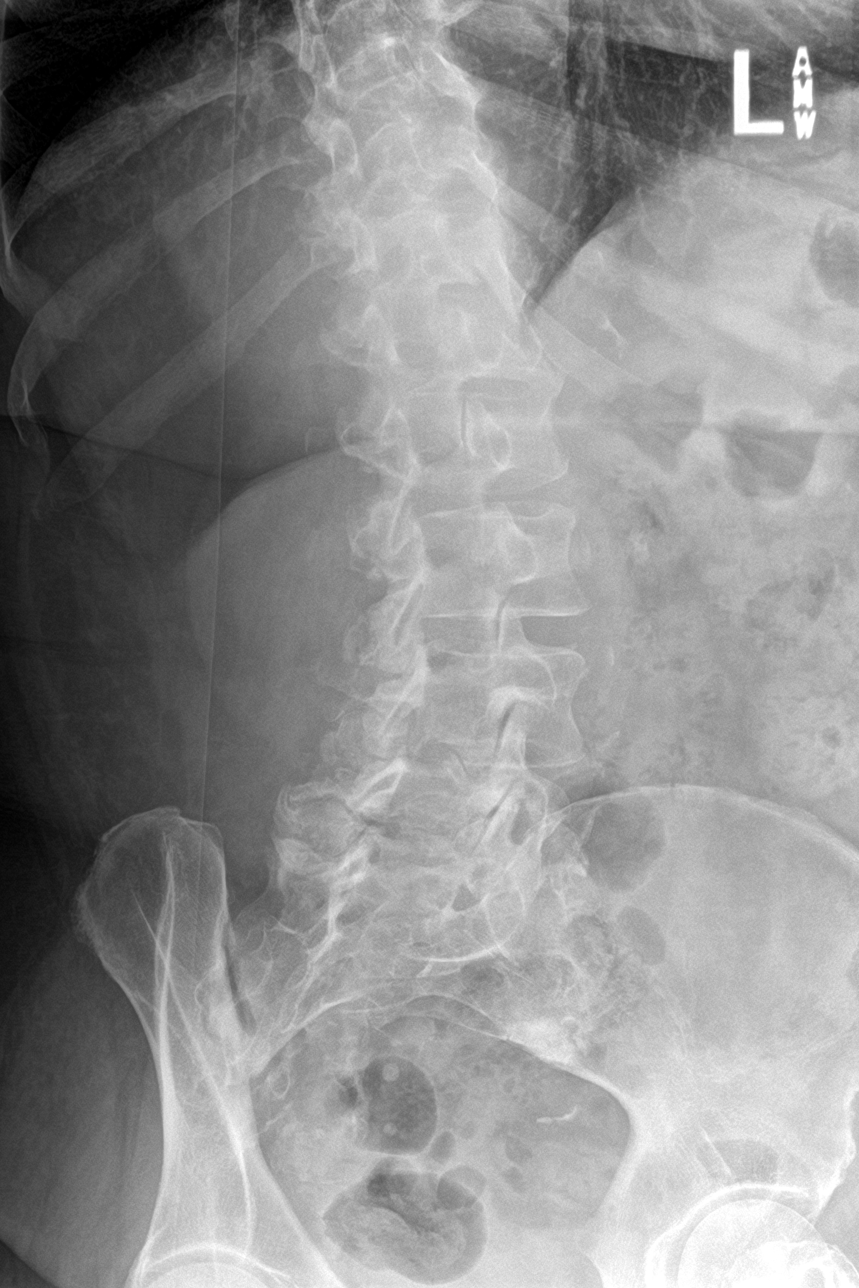

[l-spine lat]
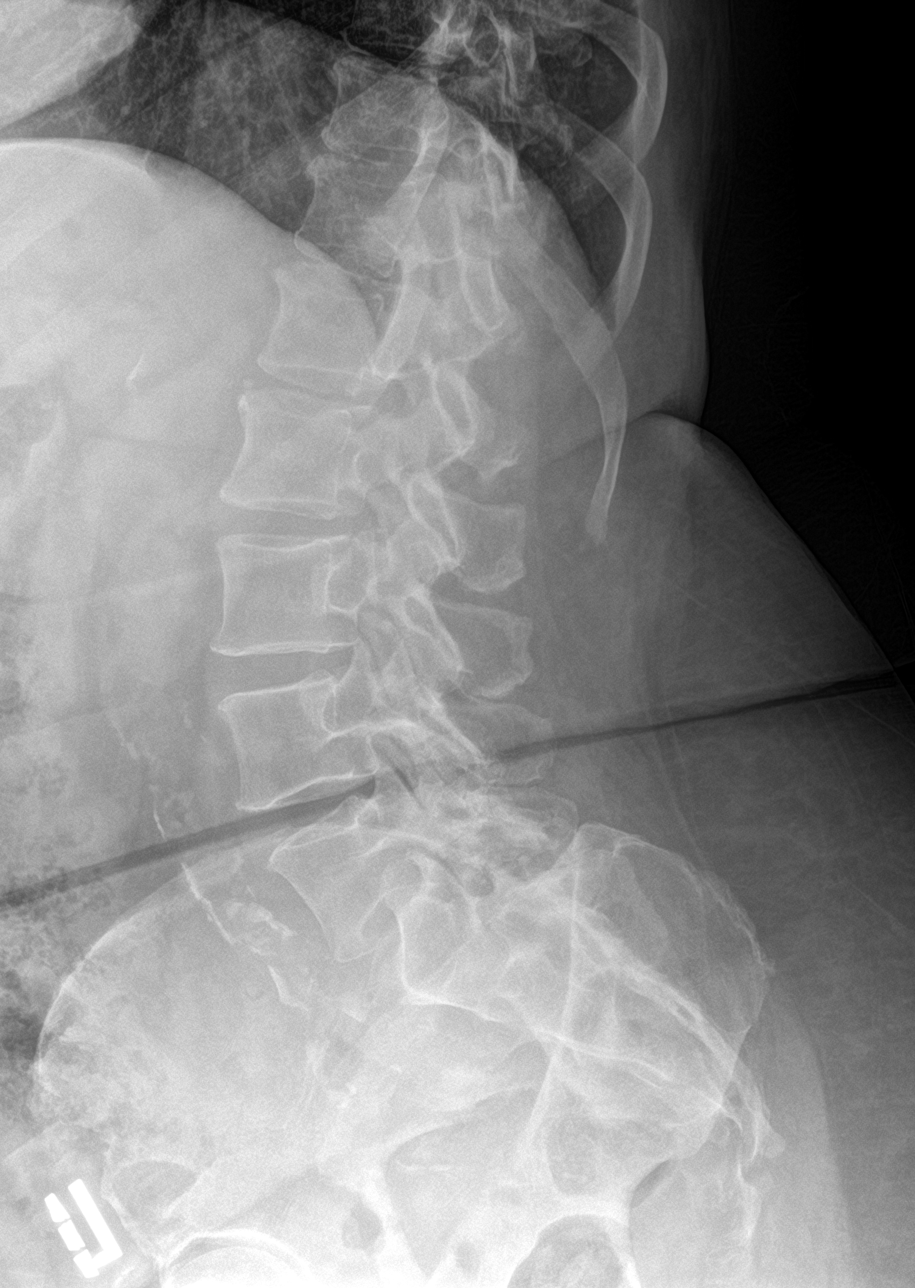

[l-spine spot]
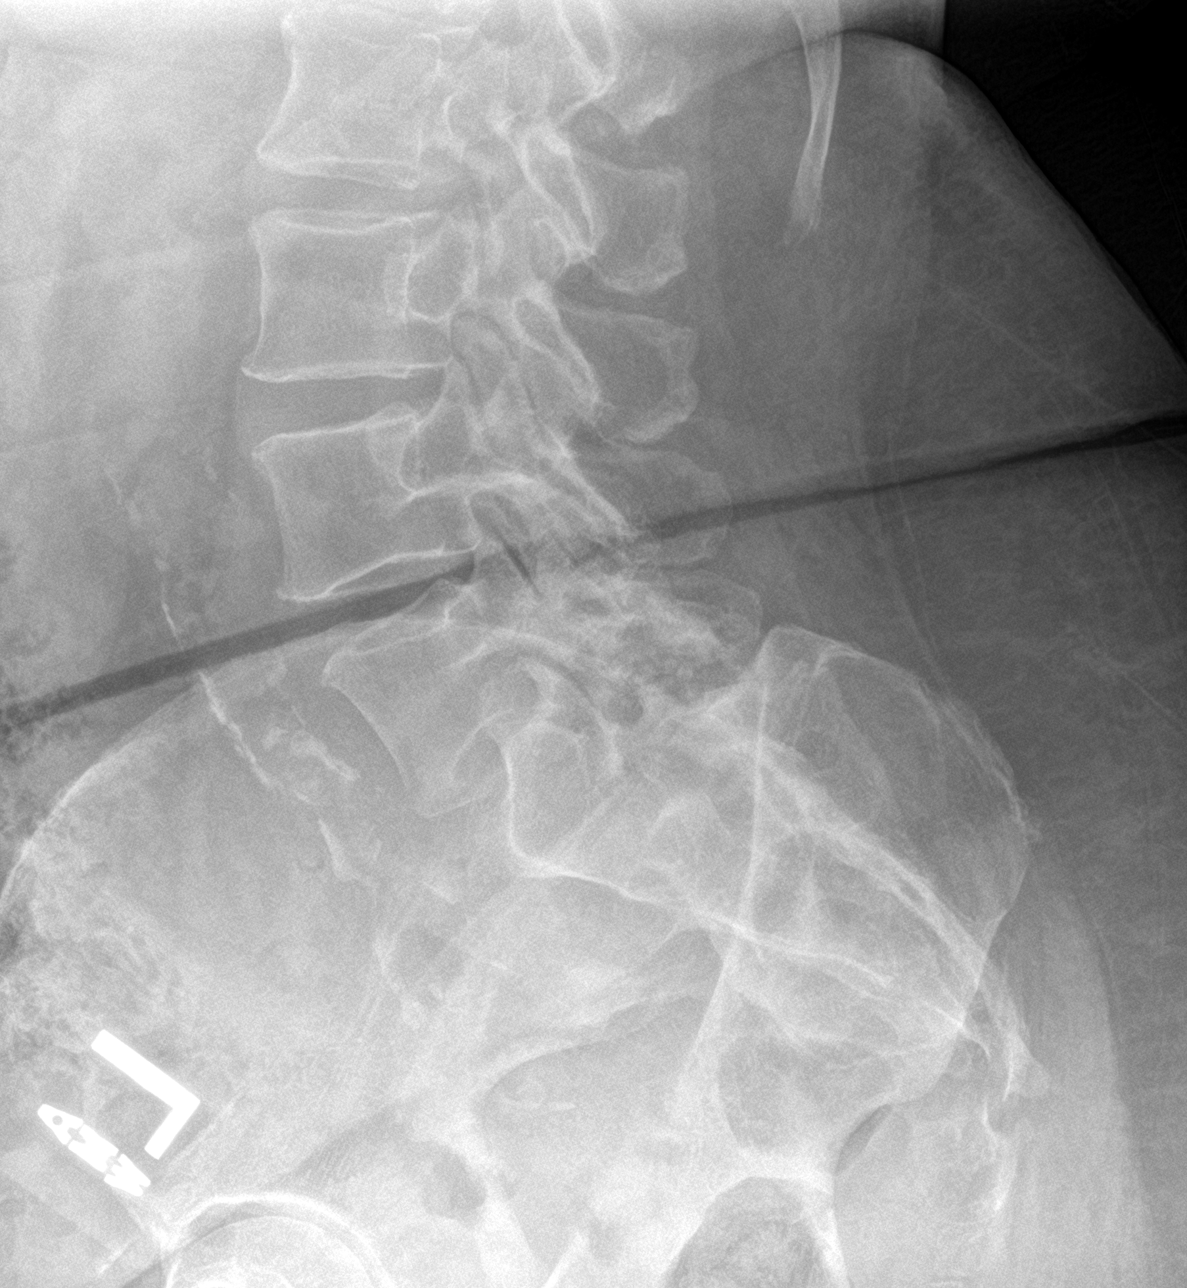

[5 of 5 positions shown; findings below may reference images not displayed]

FINDINGS: Lateral imaging is limited by rotation. There is no evident fracture
or traumatic malalignment. Lucency through the L1 right transverse
process is chronic based on 1721 abdominal CT. Bulky degenerative
spurring in the lower lumbar spine. Disc heights are well preserved,
especially for age. Atherosclerotic calcification.
IMPRESSION: 1. No acute finding.
2. Advanced lower lumbar facet arthropathy.

## 2020-01-10 DIAGNOSIS — I10 Essential (primary) hypertension: Secondary | ICD-10-CM | POA: Diagnosis not present

## 2020-01-10 DIAGNOSIS — Z961 Presence of intraocular lens: Secondary | ICD-10-CM | POA: Diagnosis not present

## 2020-01-10 DIAGNOSIS — H401131 Primary open-angle glaucoma, bilateral, mild stage: Secondary | ICD-10-CM | POA: Diagnosis not present

## 2020-01-10 DIAGNOSIS — H43811 Vitreous degeneration, right eye: Secondary | ICD-10-CM | POA: Diagnosis not present

## 2020-04-28 DIAGNOSIS — I10 Essential (primary) hypertension: Secondary | ICD-10-CM | POA: Diagnosis not present

## 2020-04-28 DIAGNOSIS — F17210 Nicotine dependence, cigarettes, uncomplicated: Secondary | ICD-10-CM | POA: Diagnosis not present

## 2020-04-28 DIAGNOSIS — E785 Hyperlipidemia, unspecified: Secondary | ICD-10-CM | POA: Diagnosis not present

## 2020-04-28 DIAGNOSIS — E119 Type 2 diabetes mellitus without complications: Secondary | ICD-10-CM | POA: Diagnosis not present

## 2020-05-01 NOTE — Progress Notes (Signed)
    SUBJECTIVE:   CHIEF COMPLAINT / HPI:   Alicia [name] is a 69 yr old female who presents for annual physical  Current concerns include:  Pre-diabetes Last A1c 5.7 1 year ago. Declined metformin at previous visit. Also declined ASA for secondary prevention. Denies polyuria, polydipsia, blurred vision, fatigue etc. Leads an active life, regularly mowing lawns. Eats healthy diet with vegetables and less starchy foods. Does not drink sodas, sweets etc.  HLD Denies taking Lipitor 40mg  daily. She asked god to heal her with this and her medical conditions. She believes he has and does not want to take medications.    HTN Denies chest pain, dizziness, palpitations, edema. Stopped talking lisinopril which was previously prescribed to her as she does not want to be on medications.  Smoking  Smokes 1/2 pack a day. Smokes more when she is stressed. Did quit for up to 4 years in the past.-went cold .  She will try to quit smoking again.  Health maintenance: Covid vaccine: Has not received. Colonoscopy: Overdue, will call GI regarding cologuardy. Declined colonoscopy   PERTINENT  PMH / PSH:  Prediabetes, HTN, h/o OSA, schizophrenia, OA, GERD, HLD.    OBJECTIVE:   BP 134/82   Pulse 74   Ht 5\' 4"  (1.626 m)   Wt 202 lb 3.2 oz (91.7 kg)   SpO2 99%   BMI 34.71 kg/m    General: Alert, no acute distress, pleasant  Cardio: Normal S1 and S2, RRR Pulm: CTAB, Normal respiratory effort Abdomen: Bowel sounds normal. Abdomen soft and non-tender.  Extremities: No peripheral edema. Neuro: Cranial nerves grossly intact  ASSESSMENT/PLAN:   TOBACCO USER Pt continues to smoke. Explained increased risk of MI, stroke etc with continuing smoking. Smoking cessation counseling provided and offer nicotine patch/gum etc. Pt reports it does not work for her. She will try stopping "cold [place]" again.   Prediabetes A1c 5.7 today, prediabetic. Will continue to monitor in 3-6 months. Diet and  exercise counseling provided to pt.  Dyslipidemia LDL 132. ASCVD risk: 21.3%, recommends moderate-high intensity statin and starting ASA according to USPSTF. Discussed the above with the pt but she declined both medications. I explained that if she changes her mind I would be happy to prescribe them for her.   HYPERTENSION, BENIGN SYSTEMIC BP at goal. Pt declined medications today. Continue to monitor.   OBESITY, NOS Referred to [facility] for weight loss management as pt is agreeable to this. Diet and exercise counseling provided.   Pt received flu vaccine today. Counseled her on the importance of receiving the COVID vaccine.   , MD PGY2 [facility]

## 2020-05-02 ENCOUNTER — Encounter: Payer: Self-pay | Admitting: Family Medicine

## 2020-05-02 ENCOUNTER — Ambulatory Visit (INDEPENDENT_AMBULATORY_CARE_PROVIDER_SITE_OTHER): Payer: Medicare Other | Admitting: Family Medicine

## 2020-05-02 ENCOUNTER — Other Ambulatory Visit: Payer: Self-pay

## 2020-05-02 VITALS — BP 134/82 | HR 74 | Ht 64.0 in | Wt 202.2 lb

## 2020-05-02 DIAGNOSIS — R7303 Prediabetes: Secondary | ICD-10-CM | POA: Insufficient documentation

## 2020-05-02 DIAGNOSIS — F17200 Nicotine dependence, unspecified, uncomplicated: Secondary | ICD-10-CM | POA: Diagnosis not present

## 2020-05-02 DIAGNOSIS — Z23 Encounter for immunization: Secondary | ICD-10-CM

## 2020-05-02 DIAGNOSIS — E785 Hyperlipidemia, unspecified: Secondary | ICD-10-CM | POA: Diagnosis not present

## 2020-05-02 DIAGNOSIS — Z6834 Body mass index (BMI) 34.0-34.9, adult: Secondary | ICD-10-CM

## 2020-05-02 DIAGNOSIS — E119 Type 2 diabetes mellitus without complications: Secondary | ICD-10-CM | POA: Diagnosis not present

## 2020-05-02 DIAGNOSIS — R7309 Other abnormal glucose: Secondary | ICD-10-CM | POA: Diagnosis not present

## 2020-05-02 DIAGNOSIS — I10 Essential (primary) hypertension: Secondary | ICD-10-CM | POA: Diagnosis not present

## 2020-05-02 DIAGNOSIS — E6609 Other obesity due to excess calories: Secondary | ICD-10-CM

## 2020-05-02 LAB — POCT GLYCOSYLATED HEMOGLOBIN (HGB A1C): HbA1c, POC (controlled diabetic range): 5.7 % (ref 0.0–7.0)

## 2020-05-02 NOTE — Patient Instructions (Addendum)
Mrs. Troy Sine, It was great to meet you today!! Your labs show that you are prediabetic and not diabetic.  We will continue to keep an eye on this and if you develop diabetes we can start your medication called Metformin.  We spoke about stopping smoking.  Smoking can increase your risk of having heart attacks and strokes in the future.  Please let me know if you need any help with stopping smoking.  Please let me know if you change your mind about going on a blood pressure medicine.  I obtained some labs today: Cholesterol and kidney function.  I will contact you if the labs are abnormal.  I have referred you to [facility] for help with weight loss.  Best wishes,  Dr. Allena Katz

## 2020-05-02 NOTE — Assessment & Plan Note (Deleted)
A1c today.  

## 2020-05-03 LAB — BASIC METABOLIC PANEL
BUN/Creatinine Ratio: 9 — ABNORMAL LOW (ref 12–28)
BUN: 6 mg/dL — ABNORMAL LOW (ref 8–27)
CO2: 25 mmol/L (ref 20–29)
Calcium: 9.7 mg/dL (ref 8.7–10.3)
Chloride: 105 mmol/L (ref 96–106)
Creatinine, Ser: 0.64 mg/dL (ref 0.57–1.00)
GFR calc Af Amer: 105 mL/min/{1.73_m2} (ref >59)
GFR calc non Af Amer: 91 mL/min/{1.73_m2} (ref >59)
Glucose: 83 mg/dL (ref 65–99)
Potassium: 4.1 mmol/L (ref 3.5–5.2)
Sodium: 141 mmol/L (ref 134–144)

## 2020-05-03 LAB — LIPID PANEL
Chol/HDL Ratio: 4.2 ratio (ref 0.0–4.4)
Cholesterol, Total: 191 mg/dL (ref 100–199)
HDL: 45 mg/dL (ref >39)
LDL Chol Calc (NIH): 132 mg/dL — ABNORMAL HIGH (ref 0–99)
Triglycerides: 78 mg/dL (ref 0–149)
VLDL Cholesterol Cal: 14 mg/dL (ref 5–40)

## 2020-05-03 NOTE — Assessment & Plan Note (Signed)
A1c 5.7 today, prediabetic. Will continue to monitor in 3-6 months. Diet and exercise counseling provided to pt.

## 2020-05-03 NOTE — Assessment & Plan Note (Signed)
Pt continues to smoke. Explained increased risk of MI, stroke etc with continuing smoking. Smoking cessation counseling provided and offer nicotine patch/gum etc. Pt reports it does not work for her. She will try stopping "cold [place]" again.

## 2020-05-03 NOTE — Assessment & Plan Note (Signed)
BP at goal. Pt declined medications today. Continue to monitor.

## 2020-05-03 NOTE — Assessment & Plan Note (Signed)
LDL 132. ASCVD risk: 21.3%, recommends moderate-high intensity statin and starting ASA according to USPSTF. Discussed the above with the pt but she declined both medications. I explained that if she changes her mind I would be happy to prescribe them for her.

## 2020-05-03 NOTE — Assessment & Plan Note (Signed)
Referred to [facility] for weight loss management as pt is agreeable to this. Diet and exercise counseling provided.

## 2020-05-19 DIAGNOSIS — I70203 Unspecified atherosclerosis of native arteries of extremities, bilateral legs: Secondary | ICD-10-CM | POA: Diagnosis not present

## 2020-05-19 DIAGNOSIS — Z0001 Encounter for general adult medical examination with abnormal findings: Secondary | ICD-10-CM | POA: Diagnosis not present

## 2021-03-29 ENCOUNTER — Ambulatory Visit: Payer: Medicare Other

## 2021-03-29 NOTE — Progress Notes (Addendum)
LVM for patient - 4:00pm LVM for patient - 4:09pm LVM for patient - 4:16pm   I did reach patient and she stated has established with a new PCP -   [facility]  [phone]

## 2022-04-16 ENCOUNTER — Other Ambulatory Visit: Payer: Self-pay | Admitting: Family Medicine

## 2022-05-20 ENCOUNTER — Other Ambulatory Visit: Payer: Self-pay | Admitting: Student

## 2022-05-20 DIAGNOSIS — R2689 Other abnormalities of gait and mobility: Secondary | ICD-10-CM

## 2022-05-20 DIAGNOSIS — R6889 Other general symptoms and signs: Secondary | ICD-10-CM

## 2022-06-10 ENCOUNTER — Other Ambulatory Visit (HOSPITAL_BASED_OUTPATIENT_CLINIC_OR_DEPARTMENT_OTHER): Payer: Self-pay

## 2022-06-10 DIAGNOSIS — Z1231 Encounter for screening mammogram for malignant neoplasm of breast: Secondary | ICD-10-CM

## 2022-06-11 ENCOUNTER — Other Ambulatory Visit (HOSPITAL_BASED_OUTPATIENT_CLINIC_OR_DEPARTMENT_OTHER): Payer: Self-pay

## 2022-06-11 DIAGNOSIS — Z1231 Encounter for screening mammogram for malignant neoplasm of breast: Secondary | ICD-10-CM

## 2022-06-20 ENCOUNTER — Ambulatory Visit (HOSPITAL_BASED_OUTPATIENT_CLINIC_OR_DEPARTMENT_OTHER)
Admission: RE | Admit: 2022-06-20 | Discharge: 2022-06-20 | Disposition: A | Discharge status: Home / Self Care | Payer: Medicare Other | Source: Ambulatory Visit | Attending: Student | Admitting: Student

## 2022-06-20 ENCOUNTER — Ambulatory Visit (HOSPITAL_BASED_OUTPATIENT_CLINIC_OR_DEPARTMENT_OTHER)
Admission: RE | Admit: 2022-06-20 | Discharge: 2022-06-20 | Disposition: A | Discharge status: Home / Self Care | Payer: Medicare Other | Source: Ambulatory Visit

## 2022-06-20 ENCOUNTER — Other Ambulatory Visit (HOSPITAL_BASED_OUTPATIENT_CLINIC_OR_DEPARTMENT_OTHER): Payer: Self-pay | Admitting: Student

## 2022-06-20 DIAGNOSIS — R296 Repeated falls: Secondary | ICD-10-CM | POA: Insufficient documentation

## 2022-06-20 DIAGNOSIS — I6782 Cerebral ischemia: Secondary | ICD-10-CM | POA: Insufficient documentation

## 2022-06-20 DIAGNOSIS — R2689 Other abnormalities of gait and mobility: Secondary | ICD-10-CM | POA: Diagnosis present

## 2022-06-20 DIAGNOSIS — R6889 Other general symptoms and signs: Secondary | ICD-10-CM

## 2022-06-20 DIAGNOSIS — Z1231 Encounter for screening mammogram for malignant neoplasm of breast: Secondary | ICD-10-CM

## 2022-08-09 ENCOUNTER — Other Ambulatory Visit: Payer: Self-pay | Admitting: Student

## 2022-08-09 DIAGNOSIS — F17210 Nicotine dependence, cigarettes, uncomplicated: Secondary | ICD-10-CM

## 2023-01-13 ENCOUNTER — Emergency Department (HOSPITAL_COMMUNITY)
Admission: EM | Admit: 2023-01-13 | Discharge: 2023-01-13 | Disposition: A | Discharge status: Home / Self Care | Payer: 59 | Attending: Emergency Medicine | Admitting: Emergency Medicine

## 2023-01-13 ENCOUNTER — Emergency Department (HOSPITAL_COMMUNITY): Payer: 59

## 2023-01-13 ENCOUNTER — Other Ambulatory Visit: Payer: Self-pay

## 2023-01-13 ENCOUNTER — Encounter (HOSPITAL_COMMUNITY): Payer: Self-pay

## 2023-01-13 DIAGNOSIS — M25561 Pain in right knee: Secondary | ICD-10-CM | POA: Diagnosis present

## 2023-01-13 DIAGNOSIS — M25461 Effusion, right knee: Secondary | ICD-10-CM

## 2023-01-13 DIAGNOSIS — M1711 Unilateral primary osteoarthritis, right knee: Secondary | ICD-10-CM | POA: Diagnosis not present

## 2023-01-13 MED ORDER — PREDNISONE 20 MG PO TABS
40.0000 mg | ORAL_TABLET | Freq: Once | ORAL | Status: AC
  Administered 2023-01-13: 40 mg via ORAL
  Filled 2023-01-13: qty 2

## 2023-01-13 MED ORDER — PREDNISONE 10 MG (21) PO TBPK: ORAL_TABLET | ORAL | 0 refills | Status: AC

## 2023-01-13 NOTE — ED Provider Notes (Signed)
[facility] AT [facility] Provider Note   CSN: [id number] Arrival date & time: 01/13/23  4098     History  No chief complaint on file.   Alicia Alicia [name] is a 72 y.o. Alicia [name].  HPI 72 year old Alicia [name] presents today complaining of right knee pain.  Alicia Alicia [name] states her knee has had swelling and pain for the past 10 days.  Alicia Alicia [name] denies any trauma.  Alicia Alicia [name] denies redness, warmth, fever, streaking.  Alicia Alicia [name] states that Alicia Alicia [name] has had problems with this before.  Usually when Alicia Alicia [name] puts a compression sleeve on it it improves.  Alicia Alicia [name] cannot take nonsteroidals due to allergy to aspirin.  Alicia Alicia [name] says that Alicia Alicia [name] has difficulty staying off of it.  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  Alicia Alicia [name] does not employed outside of the home.     Home Medications Prior to Admission medications   Medication Sig Start Date End Date Taking? Authorizing Provider  predniSONE (STERAPRED UNI-PAK 21 TAB) 10 MG (21) TBPK tablet Take as per package instructions 01/13/23  Yes Margarita Grizzle, MD  acetaminophen (TYLENOL) 325 MG tablet Take by mouth every 6 (six) hours as needed for mild pain or moderate pain.    [provider]  diclofenac sodium (VOLTAREN) 1 % GEL Apply 2 g topically 4 (four) times daily. 06/10/17   Tarry Kos, MD  lidocaine (LIDODERM) 5 % Place 1 patch onto the skin daily. Remove & Discard patch within 12 hours or as directed by MD 01/14/19   Shanon Ace, PA-C      Allergies    Aspirin, Banana, Chocolate, Egg yolk, Ibuprofen, and Percocet [oxycodone-acetaminophen]    Review of Systems   Review of Systems  Physical Exam Updated Vital Signs BP (!) 191/97 (BP Location: Right Arm)   Pulse 69   Temp 98.3 F (36.8 C) (Oral)   Resp 16   SpO2 100%  Physical Exam Vitals and nursing note reviewed.  Constitutional:      General: Alicia Alicia [name] is not in acute distress.    Appearance: Alicia Alicia [name] is well-developed.  HENT:     Head: Normocephalic and atraumatic.     Right  Ear: External ear normal.     Left Ear: External ear normal.     Nose: Nose normal.     Mouth/Throat:     Pharynx: Oropharynx is clear.  Eyes:     Extraocular Movements: Extraocular movements intact.     Conjunctiva/sclera: Conjunctivae normal.     Pupils: Pupils are equal, round, and reactive to light.  Cardiovascular:     Rate and Rhythm: Normal rate and regular rhythm.     Pulses: Normal pulses.  Pulmonary:     Effort: Pulmonary effort is normal.  Musculoskeletal:        General: Normal range of motion.     Cervical back: Normal range of motion and neck supple.     Comments: Right knee with some swelling noted. There is some mild effusion palpable on the medial aspect but none on the lateral aspect.  Pain with flexion Pulses intact Hip is nontender No pain or injury distal to the knee  Skin:    General: Skin is warm and dry.  Neurological:     Mental Status: Alicia Alicia [name] is alert and oriented to person, place, and time.     Motor: No abnormal muscle tone.     Coordination: Coordination normal.  Psychiatric:        Behavior: Behavior normal.  Thought Content: Thought content normal.     ED Results / Procedures / Treatments   Labs (all labs ordered are listed, but only abnormal results are displayed) Labs Reviewed - No data to display  EKG None  Radiology DG Knee Complete 4 Views Right  Result Date: 01/13/2023 CLINICAL DATA:  Right knee pain and swelling for 10 days. No known injury. EXAM: RIGHT KNEE - COMPLETE 4+ VIEW COMPARISON:  Right knee radiographs 06/18/2017 FINDINGS: Moderate to severe medial joint space narrowing and peripheral osteophytosis. The osteophytosis appears mildly increased from prior. Moderate peripheral lateral compartment degenerative osteophytosis. No joint space narrowing. Severe patellofemoral joint space narrowing. Severe superior patellar degenerative osteophytosis, unchanged from prior. Small joint effusion, similar to prior. No acute fracture  or dislocation. IMPRESSION: Moderate-to-severe medial and patellofemoral compartment osteoarthritis. Electronically Signed   By: Neita Garnet M.D.   On: 01/13/2023 11:04    Procedures Procedures    Medications Ordered in ED Medications  predniSONE (DELTASONE) tablet 40 mg (has no administration in time range)    ED Course/ Medical Decision Making/ A&P Clinical Course as of 01/13/23 1416  Mon Jan 13, 2023  1406 Right knee x-Kamil Hanigan reviewed interpreted no evidence of acute abnormality noted moderate to severe medial and patellofemoral compartment osteoarthritis noted on radiologist interpretation [DR]    Clinical Course User Index [DR] Margarita Grizzle, MD                             Medical Decision Making Amount and/or Complexity of Data Reviewed Radiology: ordered.   72 year old Alicia [name] presents today with right knee pain.  Alicia Alicia [name] has known history of osteoarthritis and has had multiple episodes in the past of swelling in her knee and pain Symptoms are consistent with osteoarthritis. Doubt acute joint infection, no evidence on x-Mry Lamia of acute fracture, no history of gout or other arthritis besides osteoarthritis. Plan Short course of steroids as patient is allergic to nonsteroidals, compression, cold therapy, and elevation.  Patient is encouraged to use her walker at home as needed and to stay off of her leg is much as possible.        Final Clinical Impression(s) / ED Diagnoses Final diagnoses:  Osteoarthritis of right knee, unspecified osteoarthritis type  Effusion of right knee    Rx / DC Orders ED Discharge Orders          Ordered    Apply wrap        01/13/23 1414    predniSONE (STERAPRED UNI-PAK 21 TAB) 10 MG (21) TBPK tablet        01/13/23 1416              Margarita Grizzle, MD 01/13/23 1416

## 2023-01-13 NOTE — Discharge Instructions (Signed)
Please use Ace wrap as needed for pressure Keep knee elevated is much as possible Use ambulatory assist Use steroids as prescribed Please follow-up with your doctor this week

## 2023-01-13 NOTE — ED Triage Notes (Signed)
Patient complains of right knee pain x 10 days-states that she thinks related to her arthritis, denies trauma. Worse with any ambulation

## 2024-01-30 ENCOUNTER — Other Ambulatory Visit: Payer: Self-pay | Admitting: Student

## 2024-01-30 DIAGNOSIS — Z87891 Personal history of nicotine dependence: Secondary | ICD-10-CM

## 2024-02-04 ENCOUNTER — Ambulatory Visit
Admission: RE | Admit: 2024-02-04 | Discharge: 2024-02-04 | Disposition: A | Discharge status: Home / Self Care | Source: Ambulatory Visit | Attending: Student | Admitting: Student

## 2024-02-04 DIAGNOSIS — Z87891 Personal history of nicotine dependence: Secondary | ICD-10-CM
